# Patient Record
Sex: Female | Born: 1980 | Race: White | Hispanic: No | Marital: Married | Smoking: Never smoker
Health system: Southern US, Community
[De-identification: ages and names within clinical notes are randomized; demographics above are authoritative.]

## PROBLEM LIST (undated history)

## (undated) DIAGNOSIS — R51 Headache: Secondary | ICD-10-CM

## (undated) DIAGNOSIS — F329 Major depressive disorder, single episode, unspecified: Secondary | ICD-10-CM

## (undated) DIAGNOSIS — G43909 Migraine, unspecified, not intractable, without status migrainosus: Secondary | ICD-10-CM

## (undated) DIAGNOSIS — C73 Malignant neoplasm of thyroid gland: Secondary | ICD-10-CM

## (undated) DIAGNOSIS — G5603 Carpal tunnel syndrome, bilateral upper limbs: Secondary | ICD-10-CM

## (undated) DIAGNOSIS — F32A Depression, unspecified: Secondary | ICD-10-CM

## (undated) DIAGNOSIS — J189 Pneumonia, unspecified organism: Secondary | ICD-10-CM

## (undated) DIAGNOSIS — Z9889 Other specified postprocedural states: Secondary | ICD-10-CM

## (undated) DIAGNOSIS — R112 Nausea with vomiting, unspecified: Secondary | ICD-10-CM

## (undated) DIAGNOSIS — E042 Nontoxic multinodular goiter: Secondary | ICD-10-CM

## (undated) DIAGNOSIS — R61 Generalized hyperhidrosis: Secondary | ICD-10-CM

## (undated) HISTORY — DX: Major depressive disorder, single episode, unspecified: F32.9

## (undated) HISTORY — DX: Migraine, unspecified, not intractable, without status migrainosus: G43.909

## (undated) HISTORY — PX: BACK SURGERY: SHX140

## (undated) HISTORY — DX: Headache: R51

## (undated) HISTORY — DX: Depression, unspecified: F32.A

## (undated) HISTORY — PX: SYMPATHECTOMY: SHX792

---
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
## 2005-09-29 ENCOUNTER — Emergency Department (HOSPITAL_COMMUNITY): Admission: EM | Admit: 2005-09-29 | Discharge: 2005-09-30 | Payer: Self-pay | Admitting: Emergency Medicine

## 2011-09-23 ENCOUNTER — Encounter (HOSPITAL_COMMUNITY): Payer: Self-pay | Admitting: *Deleted

## 2011-09-23 ENCOUNTER — Emergency Department (HOSPITAL_COMMUNITY)
Admission: EM | Admit: 2011-09-23 | Discharge: 2011-09-23 | Disposition: A | Discharge status: Home / Self Care | Payer: Self-pay | Attending: Emergency Medicine | Admitting: Emergency Medicine

## 2011-09-23 ENCOUNTER — Emergency Department (HOSPITAL_COMMUNITY): Payer: Self-pay

## 2011-09-23 DIAGNOSIS — R1310 Dysphagia, unspecified: Secondary | ICD-10-CM | POA: Insufficient documentation

## 2011-09-23 DIAGNOSIS — M542 Cervicalgia: Secondary | ICD-10-CM | POA: Insufficient documentation

## 2011-09-23 DIAGNOSIS — E049 Nontoxic goiter, unspecified: Secondary | ICD-10-CM | POA: Insufficient documentation

## 2011-09-23 MED ORDER — HYDROCODONE-ACETAMINOPHEN 5-325 MG PO TABS: 2.0000 | ORAL_TABLET | ORAL | Status: AC | PRN

## 2011-09-23 NOTE — ED Provider Notes (Signed)
History     CSN: [id number]  Arrival date & time 09/23/11  1826   First MD Initiated Contact with Patient 09/23/11 1922      Chief Complaint  Patient presents with  . Neck Pain    (Consider location/radiation/quality/duration/timing/severity/associated sxs/prior treatment) HPI Comments: Patient presenting with dysphagia for the past 3 days and pain in her neck.  She reports that it is painful to swallow, but her throat is not sore.  She is concerned that she has a mass in her neck.  She denies any drooling, changes in voice, or difficulty breathing.  She has full ROM of her neck.  Patient is a 31 y.o. female presenting with neck pain. The history is provided by the patient.  Neck Pain  This is a new problem. Episode onset: Two days ago. The problem occurs constantly. The problem has been gradually worsening. The pain is associated with nothing (No acute injury or trauma.). There has been no fever. Pain location: anterior neck. Radiates to: No radiation. The pain is mild. The symptoms are aggravated by swallowing. She has tried nothing for the symptoms.    History reviewed. No pertinent past medical history.  Past Surgical History  Procedure Date  . Back surgery     No family history on file.  History  Substance Use Topics  . Smoking status: Never Smoker   . Smokeless tobacco: Not on file  . Alcohol Use: No    OB History    Grav Para Term Preterm Abortions TAB SAB Ect Mult Living                  Review of Systems  Constitutional: Negative for fever and chills.  HENT: Positive for trouble swallowing and neck pain. Negative for sore throat, drooling, neck stiffness, dental problem and voice change.   Respiratory: Negative for shortness of breath.   Gastrointestinal: Negative for nausea and vomiting.  Skin: Negative for rash.  Neurological: Negative for dizziness and light-headedness.  Hematological: Negative for adenopathy.    Allergies  Review of patient's  allergies indicates no known allergies.  Home Medications   Current Outpatient Rx  Name Route Sig Dispense Refill  . IBUPROFEN 200 MG PO TABS Oral Take 800 mg by mouth every 8 (eight) hours as needed. Pain      BP 133/85  Pulse 86  Temp 99.2 F (37.3 C) (Oral)  Resp 22  SpO2 99%  LMP 09/08/2011  Physical Exam  Nursing note and vitals reviewed. Constitutional: She appears well-developed and well-nourished. No distress.  HENT:  Head: Normocephalic and atraumatic. No trismus in the jaw.  Right Ear: Tympanic membrane and ear canal normal.  Left Ear: Tympanic membrane and ear canal normal.  Nose: Nose normal.  Mouth/Throat: Uvula is midline, oropharynx is clear and moist and mucous membranes are normal. No uvula swelling. No oropharyngeal exudate, posterior oropharyngeal edema, posterior oropharyngeal erythema or tonsillar abscesses.  Eyes: EOM are normal. Pupils are equal, round, and reactive to light.  Neck: Normal range of motion and phonation normal. Neck supple. No spinous process tenderness and no muscular tenderness present. No rigidity. No edema, no erythema and normal range of motion present. Thyromegaly present.  Cardiovascular: Normal rate, regular rhythm and normal heart sounds.   Pulmonary/Chest: Effort normal and breath sounds normal. No stridor. No respiratory distress. She has no wheezes. She has no rales.  Lymphadenopathy:    She has no cervical adenopathy.  Neurological: She is alert.  Skin: Skin is warm  and dry. No rash noted. She is not diaphoretic. No erythema.  Psychiatric: She has a normal mood and affect.    ED Course  Procedures (including critical care time)  Labs Reviewed - No data to display Dg Neck Soft Tissue  09/23/2011  *RADIOLOGY REPORT*  Clinical Data: Difficulty swallowing for 3 days.  Left neck soft tissue swelling.  NECK SOFT TISSUES - 1+ VIEW  Comparison: None.  Findings: The epiglottis and aryepiglottic fold do not appear thickened.  No  prevertebral soft tissue swelling.  No radiopaque foreign body.  No significant narrowing of the air column.  IMPRESSION: Single lateral view of the neck without cause of patient's discomfort detected as detailed above.  Radiopaque structure projects over the mandible.  Submandibular stone not excluded.  Original Report Authenticated By: Fuller Canada, M.D.     No diagnosis found.    MDM  Patient presenting with dysphagia and pain with swallowing for three days.  She reports that her throat is not sore.    Soft tissue neck does not show an identifiable cause.  Patient able to swallowing.  No drooling.  No difficulty breathing.  Normal voice. Uvula midline.  Thyroid appears mildly enlarged, but no distinct mass palpated.  Patient instructed to follow up with PCP.          Talaysia Pinheiro Hartley, PA-C 09/24/11 1228

## 2011-09-23 NOTE — ED Notes (Signed)
Pt reports Thursday night noted to neck and pain has increased since. Pt denies history of similar issues. Pt denies pain to other locations. Pt reports pain with swallowing.

## 2011-09-25 NOTE — ED Provider Notes (Signed)
Medical screening examination/treatment/procedure(s) were performed by non-physician practitioner and as supervising physician I was immediately available for consultation/collaboration.   Forbes Cellar, MD 09/25/11 [phone]

## 2016-02-07 ENCOUNTER — Ambulatory Visit (INDEPENDENT_AMBULATORY_CARE_PROVIDER_SITE_OTHER): Payer: BC Managed Care – PPO | Admitting: Neurology

## 2016-02-07 ENCOUNTER — Encounter: Payer: Self-pay | Admitting: Neurology

## 2016-02-07 VITALS — BP 146/86 | HR 89 | Resp 20 | Ht 70.0 in | Wt 257.0 lb

## 2016-02-07 DIAGNOSIS — G43709 Chronic migraine without aura, not intractable, without status migrainosus: Secondary | ICD-10-CM

## 2016-02-07 DIAGNOSIS — H539 Unspecified visual disturbance: Secondary | ICD-10-CM

## 2016-02-07 DIAGNOSIS — R51 Headache with orthostatic component, not elsewhere classified: Secondary | ICD-10-CM

## 2016-02-07 DIAGNOSIS — E662 Morbid (severe) obesity with alveolar hypoventilation: Secondary | ICD-10-CM

## 2016-02-07 DIAGNOSIS — R110 Nausea: Secondary | ICD-10-CM

## 2016-02-07 DIAGNOSIS — F329 Major depressive disorder, single episode, unspecified: Secondary | ICD-10-CM

## 2016-02-07 DIAGNOSIS — F32A Depression, unspecified: Secondary | ICD-10-CM | POA: Insufficient documentation

## 2016-02-07 DIAGNOSIS — R519 Headache, unspecified: Secondary | ICD-10-CM

## 2016-02-07 MED ORDER — SUMATRIPTAN SUCCINATE 100 MG PO TABS: 100.0000 mg | ORAL_TABLET | Freq: Once | ORAL | 12 refills | Status: DC | PRN

## 2016-02-07 MED ORDER — PROCHLORPERAZINE MALEATE 10 MG PO TABS: 10.0000 mg | ORAL_TABLET | Freq: Four times a day (QID) | ORAL | 11 refills | Status: DC | PRN

## 2016-02-07 MED ORDER — TOPIRAMATE ER 100 MG PO CAP24: 100.0000 mg | ORAL_CAPSULE | Freq: Every day | ORAL | 11 refills | Status: DC

## 2016-02-07 NOTE — Patient Instructions (Addendum)
Remember to drink plenty of fluid, eat healthy meals and do not skip any meals. Try to eat protein with a every meal and eat a healthy snack such as fruit or nuts in between meals. Try to keep a regular sleep-wake schedule and try to exercise daily, particularly in the form of walking, 20-30 minutes a day, if you can.   As far as your medications are concerned, I would like to suggest:  Topiramate at bedtime. Start with 25mg  x 1 week, then 50mg  x 1 week then 100mg  At onset of migraine, take imitrex and compazine. May repeat Imitrex once in 2 hours if needed.   As far as diagnostic testing: MRI brain  Our phone number is [phone]. We also have an after hours call service for urgent matters and there is a physician on-call for urgent questions. For any emergencies you know to call 911 or go to the nearest emergency room  Phentermine; Topiramate extended-release capsules What is this medicine? Phentermine; topiramate (FEN ter meen; toe PYRE a mate) is a combination of two medicines used with a reduced calorie diet and exercise to help you lose weight. This medicine is only available through certified pharmacies enrolled in a special program. Your healthcare professional will tell you where you can get your medicine. If you have additional questions, you can visit the manufacture's website at [email or web address] or contact them by phone at [phone]. COMMON BRAND NAME(S): Qsymia What should I tell my health care provider before I take this medicine? They need to know if you have any of these conditions: -agitation -diarrhea -depression or other mental illness -diabetes -glaucoma -heart disease -high or low blood pressure -history of anorexia or other eating disorder -history of substance abuse -kidney stones or kidney disease -liver disease -lung disease like asthma, obstructive pulmonary disease, emphysema -metabolic acidosis -on a ketogenic diet -scheduled for surgery or a  procedure -suicidal thoughts, plans, or attempt; a previous suicide attempt by you or a family member -taken an MAOI like Carbex, Eldepryl, Marplan, Nardil, or Parnate in last 14 days -thyroid disease -an unusual or allergic reaction to phentermine, topiramate, other medicines, foods, dyes, or preservatives -pregnant or trying to get pregnant -breast-feeding How should I use this medicine? Take this medicine by mouth with a glass of water. Follow the directions on the prescription label. Do not crush or chew. This medicine is usually taken with or without food once per day in the morning. Avoid taking this medicine in the evening. It may interfere with sleep. Take your doses at regular intervals. Do not take your medicine more often than directed. A special MedGuide will be given to you by the pharmacist with each prescription and refill. Be sure to read this information carefully each time. Talk to your pediatrician regarding the use of this medicine in children. Special care may be needed. What if I miss a dose? If you miss a dose, take it as soon as you can. If it is almost time for your next dose, take only that dose. Do not take double or extra doses. What may interact with this medicine? Do not take this medicine with any of the following medications: -MAOIs like Carbex, Eldepryl, Marplan, Nardil, and ParnateThis medicine may also interact with the following medications: -acetazolamide -amitriptyline -antihistamines for allergy, cough and cold -atropine -birth control pills -carbamazepine -certain medicines for bladder problems like oxybutynin, tolterodine -certain medicines for depression, anxiety, or psychotic disturbances -certain medicines for Parkinson's disease like benztropine, trihexyphenidyl -certain medicines for stomach  problems like dicyclomine, hyoscyamine -certain medicines for travel sickness like  scopolamine -dichlorphenamide -digoxin -diltiazem -diuretics -hydrochlorothiazide -ipratropium -lithium -medicines for diabetes -medicines for pain, sleep, or muscle relaxation -methazolamide -phenytoin -pioglitazone -stimulant medicines for attention disorders, weight loss, or to stay awake -valproic acid -zonisamide What should I watch for while using this medicine? Visit your doctor or health care professional for regular checks on your progress. This medicine is intended to be used in addition to a healthy diet and appropriate exercise. The best results are achieved this way. Do not increase or in any way change your dose without consulting your doctor or health care professional. Do not take this medicine within 6 hours of bedtime. It can keep you from getting to sleep. Avoid drinks that contain caffeine and try to stick to a regular bedtime every night. Do not stop taking this medicine suddenly. This increases the risk of seizures. This medicine can decrease sweating and increase your body temperature. Watch for signs of deceased sweating or fever. Avoid extreme heat, hot baths, and saunas. Be careful about exercising, especially in hot weather. Contact your health care provider right away if you notice a fever or decrease in sweating. You should drink plenty of fluids while taking this medicine. If you have had kidney stones in the past, this will help to reduce your chances of forming kidney stones. If you have stomach pain, with nausea or vomiting and yellowing of your eyes or skin, call your doctor immediately. You may get drowsy or dizzy. Do not drive, use machinery, or do anything that needs mental alertness until you know how this medicine affects you. Do not stand or sit up quickly, especially if you are an older patient. This reduces the risk of dizzy or fainting spells. Alcohol may increase dizziness and drowsiness. Avoid alcoholic drinks. This medicine may affect blood sugar  levels. If you have diabetes, check with your doctor or health care professional before you change your diet or the dose of your diabetic medicine. Patients and their families should watch out for worsening depression or thoughts of suicide. Also watch out for sudden changes in feelings such as feeling anxious, agitated, panicky, irritable, hostile, aggressive, impulsive, severely restless, overly excited and hyperactive, or not being able to sleep. If this happens, especially at the beginning of treatment or after a change in dose, call your health care professional. If you notice blurred vision, eye pain, or other eye problems, seek medical attention at once for an eye exam. This medicine may increase the chance of developing metabolic acidosis. If left untreated, this can cause kidney stones, bone disease, or slowed growth in children. Symptoms include breathing fast, fatigue, loss of appetite, irregular heartbeat, or loss of consciousness. Call your doctor immediately if you experience any of these side effects. Also, tell your doctor about any surgery you plan on having while taking this medicine since this may increase your risk for metabolic acidosis. Women who become pregnant while using this medicine should contact their physician immediately. You should also contact The [facility] Pregnancy Surveillance Program which is a program that monitors pregnancies that occur during treatment. Contact the program by calling [phone]. What side effects may I notice from receiving this medicine? Side effects that you should report to your doctor or health care professional as soon as possible: -allergic reactions like skin rash, itching or hives, swelling of the face, lips, or tongue -blood in the urine -changes in vision -chest pain or chest tightness -confusion -depressed mood -difficulty  breathing -dizziness -fast or irregular heartbeat -feeling anxious -irritable -loss of appetite -low blood  pressure -pain in the lower back or side -pain, tingling, numbness in the hands or feet -pain when urinating -palpitations -redness, blistering, peeling or loosening of the skin, including inside the mouth -shortness of breath -suicidal thoughts or other mood changes -trouble passing urine or change in the amount of urine -trouble walking, dizziness, loss of balance or coordination -unusually weak or tired -vomiting Side effects that usually do not require medical attention (report to your doctor or health care professional if they continue or are bothersome): -change in sex drive or performance -changes in vision -constipation -diarrhea -dry mouth -headache -nausea -tremors -trouble sleeping -upset stomach Where should I keep my medicine? Keep out of the reach of children. This medicine can be abused. Keep your medicine in a safe place to protect it from theft. Do not share this medicine with anyone. Selling or giving away this medicine is dangerous and against the law. This medicine may cause accidental overdose and death if taken by other adults, children, or pets. Mix any unused medicine with a substance like cat litter or coffee grounds. Then throw the medicine away in a sealed container like a sealed bag or a coffee can with a lid. Do not use the medicine after the expiration date. Store at room temperature between 15 and 25 degrees C (59 and 77 degrees F).  2017 [employer] Standard (2015-04-01 14:30:46)  Prochlorperazine tablets What is this medicine? PROCHLORPERAZINE (proe klor PER a zeen) helps to control severe nausea and vomiting. This medicine is also used to treat schizophrenia. It can also help patients who experience anxiety that is not due to psychological illness. This medicine may be used for other purposes; ask your health care provider or pharmacist if you have questions. COMMON BRAND NAME(S): Compazine What should I tell my health care provider before I take  this medicine? They need to know if you have any of these conditions: -blood disorders or disease -dementia -liver disease or jaundice -Parkinson's disease -uncontrollable movement disorder -an unusual or allergic reaction to prochlorperazine, other medicines, foods, dyes, or preservatives -pregnant or trying to get pregnant -breast-feeding How should I use this medicine? Take this medicine by mouth with a glass of water. Follow the directions on the prescription label. Take your doses at regular intervals. Do not take your medicine more often than directed. Do not stop taking this medicine suddenly. This can cause nausea, vomiting, and dizziness. Ask your doctor or health care professional for advice. Talk to your pediatrician regarding the use of this medicine in children. Special care may be needed. While this drug may be prescribed for children as young as 2 years for selected conditions, precautions do apply. Overdosage: If you think you have taken too much of this medicine contact a poison control center or emergency room at once. NOTE: This medicine is only for you. Do not share this medicine with others. What if I miss a dose? If you miss a dose, take it as soon as you can. If it is almost time for your next dose, take only that dose. Do not take double or extra doses. What may interact with this medicine? Do not take this medicine with any of the following medications: -amoxapine -antidepressants like citalopram, escitalopram, fluoxetine, paroxetine, and sertraline -deferoxamine -dofetilide -maprotiline -tricyclic antidepressants like amitriptyline, clomipramine, imipramine, nortiptyline and others This medicine may also interact with the following medications: -lithium -medicines for pain -phenytoin -propranolol -warfarin  This list may not describe all possible interactions. Give your health care provider a list of all the medicines, herbs, non-prescription drugs, or dietary  supplements you use. Also tell them if you smoke, drink alcohol, or use illegal drugs. Some items may interact with your medicine. What should I watch for while using this medicine? Visit your doctor or health care professional for regular checks on your progress. You may get drowsy or dizzy. Do not drive, use machinery, or do anything that needs mental alertness until you know how this medicine affects you. Do not stand or sit up quickly, especially if you are an older patient. This reduces the risk of dizzy or fainting spells. Alcohol may interfere with the effect of this medicine. Avoid alcoholic drinks. This medicine can reduce the response of your body to heat or cold. Dress warm in cold weather and stay hydrated in hot weather. If possible, avoid extreme temperatures like saunas, hot tubs, very hot or cold showers, or activities that can cause dehydration such as vigorous exercise. This medicine can make you more sensitive to the sun. Keep out of the sun. If you cannot avoid being in the sun, wear protective clothing and use sunscreen. Do not use sun lamps or tanning beds/booths. Your mouth may get dry. Chewing sugarless gum or sucking hard candy, and drinking plenty of water may help. Contact your doctor if the problem does not go away or is severe. What side effects may I notice from receiving this medicine? Side effects that you should report to your doctor or health care professional as soon as possible: -blurred vision -breast enlargement in men or women -breast milk in women who are not breast-feeding -chest pain, fast or irregular heartbeat -confusion, restlessness -dark yellow or brown urine -difficulty breathing or swallowing -dizziness or fainting spells -drooling, shaking, movement difficulty (shuffling walk) or rigidity -fever, chills, sore throat -involuntary or uncontrollable movements of the eyes, mouth, head, arms, and legs -seizures -stomach area pain -unusually weak or  tired -unusual bleeding or bruising -yellowing of skin or eyes Side effects that usually do not require medical attention (report to your doctor or health care professional if they continue or are bothersome): -difficulty passing urine -difficulty sleeping -headache -sexual dysfunction -skin rash, or itching This list may not describe all possible side effects. Call your doctor for medical advice about side effects. You may report side effects to FDA at 1-800-FDA-1088. Where should I keep my medicine? Keep out of the reach of children. Store at room temperature between 15 and 30 degrees C (59 and 86 degrees F). Protect from light. Throw away any unused medicine after the expiration date. NOTE: This sheet is a summary. It may not cover all possible information. If you have questions about this medicine, talk to your doctor, pharmacist, or health care provider.  2017 Elsevier/Gold Standard (2011-07-18 16:59:39)  Sumatriptan tablets What is this medicine? SUMATRIPTAN (soo ma TRIP tan) is used to treat migraines with or without aura. An aura is a strange feeling or visual disturbance that warns you of an attack. It is not used to prevent migraines. This medicine may be used for other purposes; ask your health care provider or pharmacist if you have questions. COMMON BRAND NAME(S): Imitrex, Migraine Pack What should I tell my health care provider before I take this medicine? They need to know if you have any of these conditions: -circulation problems in fingers and toes -diabetes -heart disease -high blood pressure -high cholesterol -history of irregular heartbeat -  history of stroke -kidney disease -liver disease -postmenopausal or surgical removal of uterus and ovaries -seizures -smoke tobacco -stomach or intestine problems -an unusual or allergic reaction to sumatriptan, other medicines, foods, dyes, or preservatives -pregnant or trying to get pregnant -breast-feeding How should  I use this medicine? Take this medicine by mouth with a glass of water. Follow the directions on the prescription label. This medicine is taken at the first symptoms of a migraine. It is not for everyday use. If your migraine headache returns after one dose, you can take another dose as directed. You must leave at least 2 hours between doses, and do not take more than 100 mg as a single dose. Do not take more than 200 mg total in any 24 hour period. If there is no improvement at all after the first dose, do not take a second dose without talking to your doctor or health care professional. Do not take your medicine more often than directed. Talk to your pediatrician regarding the use of this medicine in children. Special care may be needed. Overdosage: If you think you have taken too much of this medicine contact a poison control center or emergency room at once. NOTE: This medicine is only for you. Do not share this medicine with others. What if I miss a dose? This does not apply; this medicine is not for regular use. What may interact with this medicine? Do not take this medicine with any of the following medicines: -cocaine -ergot alkaloids like dihydroergotamine, ergonovine, ergotamine, methylergonovine -feverfew -MAOIs like Carbex, Eldepryl, Marplan, Nardil, and Parnate -other medicines for migraine headache like almotriptan, eletriptan, frovatriptan, naratriptan, rizatriptan, zolmitriptan -tryptophan This medicine may also interact with the following medications: -certain medicines for depression, anxiety, or psychotic disturbances This list may not describe all possible interactions. Give your health care provider a list of all the medicines, herbs, non-prescription drugs, or dietary supplements you use. Also tell them if you smoke, drink alcohol, or use illegal drugs. Some items may interact with your medicine. What should I watch for while using this medicine? Only take this medicine for a  migraine headache. Take it if you get warning symptoms or at the start of a migraine attack. It is not for regular use to prevent migraine attacks. You may get drowsy or dizzy. Do not drive, use machinery, or do anything that needs mental alertness until you know how this medicine affects you. To reduce dizzy or fainting spells, do not sit or stand up quickly, especially if you are an older patient. Alcohol can increase drowsiness, dizziness and flushing. Avoid alcoholic drinks. Smoking cigarettes may increase the risk of heart-related side effects from using this medicine. If you take migraine medicines for 10 or more days a month, your migraines may get worse. Keep a diary of headache days and medicine use. Contact your healthcare professional if your migraine attacks occur more frequently. What side effects may I notice from receiving this medicine? Side effects that you should report to your doctor or health care professional as soon as possible: -allergic reactions like skin rash, itching or hives, swelling of the face, lips, or tongue -bloody or watery diarrhea -hallucination, loss of contact with reality -pain, tingling, numbness in the face, hands, or feet -seizures -signs and symptoms of a blood clot such as breathing problems; changes in vision; chest pain; severe, sudden headache; pain, swelling, warmth in the leg; trouble speaking; sudden numbness or weakness of the face, arm, or leg -signs and symptoms of  a dangerous change in heartbeat or heart rhythm like chest pain; dizziness; fast or irregular heartbeat; palpitations, feeling faint or lightheaded; falls; breathing problems -signs and symptoms of a stroke like changes in vision; confusion; trouble speaking or understanding; severe headaches; sudden numbness or weakness of the face, arm, or leg; trouble walking; dizziness; loss of balance or coordination -stomach pain Side effects that usually do not require medical attention (report to  your doctor or health care professional if they continue or are bothersome): -changes in taste -facial flushing -headache -muscle cramps -muscle pain -nausea, vomiting -weak or tired This list may not describe all possible side effects. Call your doctor for medical advice about side effects. You may report side effects to FDA at 1-800-FDA-1088. Where should I keep my medicine? Keep out of the reach of children. Store at room temperature between 2 and 30 degrees C (36 and 86 degrees F). Throw away any unused medicine after the expiration date. NOTE: This sheet is a summary. It may not cover all possible information. If you have questions about this medicine, talk to your doctor, pharmacist, or health care provider.  2017 [employer]vier/Gold Standard (2015-04-01 12:38:23)

## 2016-02-07 NOTE — Progress Notes (Signed)
[facility]    Provider:  Dr Jaynee Eagles Referring Provider: Kathyrn Lass, MD Primary Care Physician:  Tawanna Solo, MD  CC:  Headaches  HPI:  Tara Ferguson is a 35 y.o. female here as a referral from Dr. Sabra Heck for headaches. PMHx depression, headaches, vitamin B12 and vitamin D deficiency, obesity. Headaches in the last year, started worsening to daily the last 6-7 months.  No inciting events, no head trauma, no new medication. She wakes up with the headache sometimes but moreso develop during the day later in the morning or the day. The headache is all day. Taking excedrin or laying down don;t help.  No medication overuse. They are more on one side the right or on the back in her neck and her neck hurts and she can;t move her head. Throbbing sensation. She wakes up with headaches and she is usuallu on her back. Smoke can trigger, smells can trigger. +nuasea. She feels better in a dark room laying down. Brother with migraines. She has severe nausea. She has scalp tenderness in the occipital area. She can function but it is hard to get through the day. No aura. She sleeps well, get enough sleep. She is fatigued, She hydrates well. She kept a headache diary and no significant patterns or foods that would trigger. Sitting in a dark quiet room is better. 30/30 headache days and at least 15 are migrainous. She has vision changes with the headaches. No other associated symptoms, modifiable factors or focal neurologic deficits.   Medications tried: Excedrin, Wellbutrin  Review of Systems: Patient complains of symptoms per HPI as well as the following symptoms: No CP, no SOB, no cardiac history. Pertinent negatives per HPI. All others negative.   Social History   Social History  . Marital status: Married    Spouse name: N/A  . Number of children: N/A  . Years of education: N/A   Occupational History  . Not on file.   Social History Main Topics  . Smoking status: Never  Smoker  . Smokeless tobacco: Never Used  . Alcohol use No  . Drug use: No  . Sexual activity: Not on file   Other Topics Concern  . Not on file   Social History Narrative  . No narrative on file    Family History  Problem Relation Age of Onset  . Depression Father   . Migraines Brother     Past Medical History:  Diagnosis Date  . Depression   . Headache     Past Surgical History:  Procedure Laterality Date  . NO PAST SURGERIES      Current Outpatient Prescriptions  Medication Sig Dispense Refill  . buPROPion (WELLBUTRIN XL) 150 MG 24 hr tablet Take 150 mg by mouth daily.    [name] buPROPion (WELLBUTRIN XL) 300 MG 24 hr tablet Take 300 mg by mouth daily.    [name] venlafaxine XR (EFFEXOR-XR) 75 MG 24 hr capsule Take 75 mg by mouth daily.    . prochlorperazine (COMPAZINE) 10 MG tablet Take 1 tablet (10 mg total) by mouth every 6 (six) hours as needed for nausea or vomiting. 30 tablet 11  . SUMAtriptan (IMITREX) 100 MG tablet Take 1 tablet (100 mg total) by mouth once as needed. May repeat in 2 hours if headache persists or recurs. 10 tablet 12  . Topiramate ER (TROKENDI XR) 100 MG CP24 Take 100 mg by mouth at bedtime. 30 capsule 11   No current facility-administered medications for this visit.  Allergies as of 02/07/2016  . (No Known Allergies)    Vitals: BP (!) 146/86   Pulse 89   Resp 20   Ht 5\' 10"  (1.778 m)   Wt 257 lb (116.6 kg)   BMI 36.88 kg/m  Last Weight:  Wt Readings from Last 1 Encounters:  02/07/16 257 lb (116.6 kg)   Last Height:   Ht Readings from Last 1 Encounters:  02/07/16 5\' 10"  (1.778 m)    Physical exam: Exam: Gen: NAD, conversant, well nourised, obese, well groomed                     CV: RRR, no MRG. No Carotid Bruits. No peripheral edema, warm, nontender Eyes: Conjunctivae clear without exudates or hemorrhage  Neuro: Detailed Neurologic Exam  Speech:    Speech is normal; fluent and spontaneous with normal comprehension.    Cognition:    The patient is oriented to person, place, and time;     recent and remote memory intact;     language fluent;     normal attention, concentration,     fund of knowledge Cranial Nerves:    The pupils are equal, round, and reactive to light. The fundi are normal and spontaneous venous pulsations are present. Visual fields are full to finger confrontation. Extraocular movements are intact. Trigeminal sensation is intact and the muscles of mastication are normal. The face is symmetric. The palate elevates in the midline. Hearing intact. Voice is normal. Shoulder shrug is normal. The tongue has normal motion without fasciculations.   Coordination:    Normal finger to nose and heel to shin. Normal rapid alternating movements.   Gait:    Heel-toe and tandem gait are normal.   Motor Observation:    No asymmetry, no atrophy, and no involuntary movements noted. Tone:    Normal muscle tone.    Posture:    Posture is normal. normal erect    Strength:    Strength is V/V in the upper and lower limbs.      Sensation: intact to LT     Reflex Exam:  DTR's:    Deep tendon reflexes in the upper and lower extremities are normal bilaterally.   Toes:    The toes are downgoing bilaterally.   Clonus:    Clonus is absent.      Assessment/Plan:  35 year old with chronic migraines without aura not intractable without status migrainosus.   Sleep evaluation: Waking up with headaches and morning headaches. R/o OSA and obesity hypoventilation syndrome MRI brain w/wo contrast due to worsening, positional morning headache with vision changes to rule out lesion in the brain, space occupying tumor and to look for signs of pseudotumor cerebri  Topiramate ER at bedtime. Start with 25mg  x 1 week, then 50mg  x 1 week then 100mg  At onset of migraine, take imitrex and compazine. May repeat Imitrex once in 2 hours if needed. No more than 2x in one day.  Labs: [place]   Discussed side effects  and teratogenicity, do not get pregnant, as per AVS.   As far as diagnostic testing: MRI brain   Discussed: To prevent or relieve headaches, try the following: Cool Compress. Lie down and place a cool compress on your head.  Avoid headache triggers. If certain foods or odors seem to have triggered your migraines in the past, avoid them. A headache diary might help you identify triggers.  Include physical activity in your daily routine. Try a daily walk or  other moderate aerobic exercise.  Manage stress. Find healthy ways to cope with the stressors, such as delegating tasks on your to-do list.  Practice relaxation techniques. Try deep breathing, yoga, massage and visualization.  Eat regularly. Eating regularly scheduled meals and maintaining a healthy diet might help prevent headaches. Also, drink plenty of fluids.  Follow a regular sleep schedule. Sleep deprivation might contribute to headaches Consider biofeedback. With this mind-body technique, you learn to control certain bodily functions - such as muscle tension, heart rate and blood pressure - to prevent headaches or reduce headache pain.    Proceed to emergency room if you experience new or worsening symptoms or symptoms do not resolve, if you have new neurologic symptoms or if headache is severe, or for any concerning symptom.   Cc: Dr. Delrae Sawyers, MD  [facility] [address] [place], [place] [address]  Phone [phone] Fax [phone]  A total of 60 minutes was spent face-to-face with this patient. Over half this time was spent on counseling patient on the chronic migraine diagnosis and different diagnostic and therapeutic options available.

## 2016-02-08 ENCOUNTER — Encounter: Payer: Self-pay | Admitting: Neurology

## 2016-02-08 ENCOUNTER — Telehealth: Payer: Self-pay | Admitting: *Deleted

## 2016-02-08 LAB — CBC
Hematocrit: 38.9 % (ref 34.0–46.6)
Hemoglobin: 13.2 g/dL (ref 11.1–15.9)
MCH: 31.5 pg (ref 26.6–33.0)
MCHC: 33.9 g/dL (ref 31.5–35.7)
MCV: 93 fL (ref 79–97)
Platelets: 219 10*3/uL (ref 150–379)
RBC: 4.19 x10E6/uL (ref 3.77–5.28)
RDW: 13.6 % (ref 12.3–15.4)
WBC: 5.8 10*3/uL (ref 3.4–10.8)

## 2016-02-08 LAB — COMPREHENSIVE METABOLIC PANEL
ALT: 34 IU/L — ABNORMAL HIGH (ref 0–32)
AST: 24 IU/L (ref 0–40)
Albumin/Globulin Ratio: 1.9 (ref 1.2–2.2)
Albumin: 4.5 g/dL (ref 3.5–5.5)
Alkaline Phosphatase: 97 IU/L (ref 39–117)
BUN/Creatinine Ratio: 12 (ref 9–23)
BUN: 8 mg/dL (ref 6–20)
Bilirubin Total: 0.3 mg/dL (ref 0.0–1.2)
CO2: 21 mmol/L (ref 18–29)
Calcium: 9 mg/dL (ref 8.7–10.2)
Chloride: 103 mmol/L (ref 96–106)
Creatinine, Ser: 0.68 mg/dL (ref 0.57–1.00)
GFR calc Af Amer: 133 mL/min/{1.73_m2} (ref >59)
GFR calc non Af Amer: 115 mL/min/{1.73_m2} (ref >59)
Globulin, Total: 2.4 g/dL (ref 1.5–4.5)
Glucose: 90 mg/dL (ref 65–99)
Potassium: 4.4 mmol/L (ref 3.5–5.2)
Sodium: 144 mmol/L (ref 134–144)
Total Protein: 6.9 g/dL (ref 6.0–8.5)

## 2016-02-08 LAB — TSH: TSH: 1.58 u[IU]/mL (ref 0.450–4.500)

## 2016-02-08 NOTE — Telephone Encounter (Signed)
-----   Message from Melvenia Beam, MD sent at 02/08/2016  5:32 PM EST ----- Labs unremarkable thanks

## 2016-02-08 NOTE — Telephone Encounter (Signed)
Called and spoke to pt about unremarkable labs per AA,MD. Pt verbalized understanding.

## 2016-02-09 ENCOUNTER — Encounter: Payer: Self-pay | Admitting: Neurology

## 2016-02-16 ENCOUNTER — Other Ambulatory Visit: Payer: BC Managed Care – PPO

## 2016-02-23 ENCOUNTER — Encounter: Payer: Self-pay | Admitting: Neurology

## 2016-02-25 ENCOUNTER — Other Ambulatory Visit: Payer: Self-pay | Admitting: Neurology

## 2016-02-25 MED ORDER — RIZATRIPTAN BENZOATE 10 MG PO TBDP: 10.0000 mg | ORAL_TABLET | ORAL | 11 refills | Status: DC | PRN

## 2016-02-29 ENCOUNTER — Ambulatory Visit (INDEPENDENT_AMBULATORY_CARE_PROVIDER_SITE_OTHER): Payer: BC Managed Care – PPO | Admitting: Neurology

## 2016-02-29 ENCOUNTER — Encounter: Payer: Self-pay | Admitting: Neurology

## 2016-02-29 VITALS — BP 134/89 | HR 63 | Resp 20 | Ht 70.0 in | Wt 253.0 lb

## 2016-02-29 DIAGNOSIS — F39 Unspecified mood [affective] disorder: Secondary | ICD-10-CM

## 2016-02-29 DIAGNOSIS — R51 Headache: Secondary | ICD-10-CM

## 2016-02-29 DIAGNOSIS — E669 Obesity, unspecified: Secondary | ICD-10-CM

## 2016-02-29 DIAGNOSIS — R0683 Snoring: Secondary | ICD-10-CM

## 2016-02-29 DIAGNOSIS — R400 Somnolence: Principal | ICD-10-CM

## 2016-02-29 DIAGNOSIS — R519 Headache, unspecified: Secondary | ICD-10-CM

## 2016-02-29 NOTE — Progress Notes (Signed)
Subjective:    Patient ID: Tara Ferguson is a 35 y.o. female.  HPI     Star Age, MD, PhD [facility] [address] [place], [place] [address]  Dear Tara Ferguson,   I saw your patient, Tara Ferguson, upon your kind request in my clinic today for initial consultation of her sleep disorder, in particular, concern for underlying obstructive sleep apnea. The patient is unaccompanied today (has the little boy she takes care of with her). As you know, [name] is a 35 year old right-handed woman with an underlying medical history of depression, recurrent migraines and obesity, who reports morning headaches, snoring and excessive daytime somnolence. I reviewed your office note from 02/07/2016. Her Epworth sleepiness score is 16 out of 24 today, her fatigue score is 51 out of 63. She is a nonsmoker, she does not drink alcohol or use illicit drugs. She does not drink caffeine on a daily basis, since she started the topamax. She was a soda drinker, up to 2 l per day, now just one glass of tea. She is married and lives with her wife and her 2 young children, ages 19 yo and 40 yo. She has a history of depression. She was started on Wellbutrin about a year ago and Effexor was added a few months ago. She has had worsening headaches and morning headaches for at least 6 months. She has been tired during the day before she started her antidepressant medications. She has not fallen asleep at work or at the wheel. 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  She goes to bed between 9 and 10 PM and rise time is around 7:30 AM. She has tried losing weight, lost about 10 lb in one year. She does not endorse a family history of obstructive sleep apnea. She is familiar with the diagnosis as her stepfather has sleep apnea and CPAP machine. She has frequent AM HAs, no nocturia, no RLS symptoms, does watch TV sitting up in bed, but turns it  off. Both children sleep in their own bedroom.  Her Past Medical History Is Significant For: Past Medical History:  Diagnosis Date  . Depression   . Headache     Her Past Surgical History Is Significant For: Past Surgical History:  Procedure Laterality Date  . NO PAST SURGERIES      Her Family History Is Significant For: Family History  Problem Relation Age of Onset  . Depression Father   . Migraines Brother     Her Social History Is Significant For: Social History   Social History  . Marital status: Married    Spouse name: N/A  . Number of children: N/A  . Years of education: N/A   Social History Main Topics  . Smoking status: Never Smoker  . Smokeless tobacco: Never Used  . Alcohol use No  . Drug use: No  . Sexual activity: Not Asked   Other Topics Concern  . None   Social History Narrative  . None    Her Allergies Are:  No Known Allergies:   Her Current Medications Are:  Outpatient Encounter Prescriptions as of 02/29/2016  Medication Sig  . buPROPion (WELLBUTRIN XL) 150 MG 24 hr tablet Take 150 mg by mouth daily.  [name] buPROPion (WELLBUTRIN XL) 300 MG 24 hr tablet Take 300 mg by mouth daily.  . prochlorperazine (COMPAZINE) 10 MG tablet Take 1 tablet (10 mg  total) by mouth every 6 (six) hours as needed for nausea or vomiting.  . rizatriptan (MAXALT-MLT) 10 MG disintegrating tablet Take 1 tablet (10 mg total) by mouth as needed for migraine. May repeat once in 2 hours if needed  . Topiramate ER (TROKENDI XR) 100 MG CP24 Take 100 mg by mouth at bedtime.  [name] venlafaxine XR (EFFEXOR-XR) 75 MG 24 hr capsule Take 75 mg by mouth daily.   No facility-administered encounter medications on file as of 02/29/2016.   :  Review of Systems:  Out of a complete 14 point review of systems, all are reviewed and negative with the exception of these symptoms as listed below: Review of Systems  Eyes: Positive for visual disturbance.  Neurological: Positive for numbness and  headaches.       Pt presents today to discuss her sleep. Pt has never had a sleep study and does not know if she snores. She wakes up with headaches and reports that she is tired all day.  Epworth Sleepiness Scale 0= would never doze 1= slight chance of dozing 2= moderate chance of dozing 3= high chance of dozing  Sitting and reading: 3 Watching TV: 3 Sitting inactive in a public place (ex. Theater or meeting): 1 As a passenger in a car for an hour without a break: 3 Lying down to rest in the afternoon: 3 Sitting and talking to someone: 0 Sitting quietly after lunch (no alcohol): 3 In a car, while stopped in traffic: 0 Total: 16   Psychiatric/Behavioral: Positive for sleep disturbance.    Objective:  Neurologic Exam  Physical Exam Physical Examination:   Vitals:   02/29/16 1111  BP: 134/89  Pulse: 63  Resp: 20   General Examination: The patient is a very pleasant 35 y.o. female in no acute distress. She appears well-developed and well-nourished and well groomed.   HEENT: Normocephalic, atraumatic, pupils are equal, round and reactive to light and accommodation. Extraocular tracking is good without limitation to gaze excursion or nystagmus noted. Normal smooth pursuit is noted. Hearing is grossly intact. Tympanic membranes are clear bilaterally. Face is symmetric with normal facial animation and normal facial sensation. Speech is clear with no dysarthria noted. There is no hypophonia. There is no lip, neck/head, jaw or voice tremor. Neck is supple with full range of passive and active motion. There are no carotid bruits on auscultation. Oropharynx exam reveals: mild mouth dryness, good dental hygiene and mild airway crowding, due to smaller airway opening. Mallampati is class II. Tongue protrudes centrally and palate elevates symmetrically. Tonsils are 1+ in size. Neck size is 16.5 inches. She has a Mild overbite. Nasal inspection reveals no significant nasal mucosal bogginess or  redness and no septal deviation.   Chest: Clear to auscultation without wheezing, rhonchi or crackles noted.  Heart: S1+S2+0, regular and normal without murmurs, rubs or gallops noted.   Abdomen: Soft, non-tender and non-distended with normal bowel sounds appreciated on auscultation.  Extremities: There is no pitting edema in the distal lower extremities bilaterally. Pedal pulses are intact.  Skin: Warm and dry without trophic changes noted.   Musculoskeletal: exam reveals no obvious joint deformities, tenderness or joint swelling or erythema.   Neurologically:  Mental status: The patient is awake, alert and oriented in all 4 spheres. Her immediate and remote memory, attention, language skills and fund of knowledge are appropriate. There is no evidence of aphasia, agnosia, apraxia or anomia. Speech is clear with normal prosody and enunciation. Thought process is linear. Mood  is normal and affect is normal.  Cranial nerves II - XII are as described above under HEENT exam. In addition: shoulder shrug is normal with equal shoulder height noted. Motor exam: Normal bulk, strength and tone is noted. There is no drift, tremor or rebound. Romberg is negative. Reflexes are 2+ throughout. Babinski: Toes are flexor bilaterally. Fine motor skills and coordination: intact with normal finger taps, normal hand movements, normal rapid alternating patting, normal foot taps and normal foot agility.  Cerebellar testing: No dysmetria or intention tremor on finger to nose testing. Heel to shin is unremarkable bilaterally. There is no truncal or gait ataxia.  Sensory exam: intact to light touch in the upper and lower extremities.  Gait, station and balance: She stands easily. No veering to one side is noted. No leaning to one side is noted. Posture is age-appropriate and stance is narrow based. Gait shows normal stride length and normal pace. No problems turning are noted. Tandem walk is unremarkable.            Assessment and Plan:   In summary, Malli Valk is a very pleasant 35 y.o.-year old female with an underlying medical history of depression, recurrent migraines and obesity, whose history and physical exam concerning for obstructive sleep apnea (OSA). She reports significant daytime somnolence, morning headaches, and given her history of obesity, mood disorder on antidepressant medication and recurrent headaches, as well as worsening headaches over the past 6 months, sleep study testing with a a lab attended sleep study is justified. I had a long chat with the patient about my findings and the diagnosis of OSA, its prognosis and treatment options. We talked about medical treatments, surgical interventions and non-pharmacological approaches. I explained in particular the risks and ramifications of untreated moderate to severe OSA, especially with respect to developing cardiovascular disease down the Road, including congestive heart failure, difficult to treat hypertension, cardiac arrhythmias, or stroke. Even type 2 diabetes has, in part, been linked to untreated OSA. Symptoms of untreated OSA include daytime sleepiness, memory problems, mood irritability and mood disorder such as depression and anxiety, lack of energy, as well as recurrent headaches, especially morning headaches. We talked about trying to maintain a healthy lifestyle in general, as well as the importance of weight control. I encouraged the patient to eat healthy, exercise daily and keep well hydrated, to keep a scheduled bedtime and wake time routine, to not skip any meals and eat healthy snacks in between meals. I advised the patient not to drive when feeling sleepy. I recommended the following at this time: sleep study with potential positive airway pressure titration. (We will score hypopneas at 3% and split the sleep study into diagnostic and treatment portion, if the estimated. 2 hour AHI is >15/h).   I explained the sleep test  procedure to the patient and also outlined possible surgical and non-surgical treatment options of OSA, including the use of a custom-made dental device (which would require a referral to a specialist dentist or oral surgeon), upper airway surgical options, such as pillar implants, radiofrequency surgery, tongue base surgery, and UPPP (which would involve a referral to an ENT surgeon). Rarely, jaw surgery such as mandibular advancement may be considered.  I also explained the CPAP treatment option to the patient, who indicated that she would be willing to try CPAP if the need arises. I explained the importance of being compliant with PAP treatment, not only for insurance purposes but primarily to improve Her symptoms, and for the patient's long  term health benefit, including to reduce Her cardiovascular risks. I answered all her questions today and the patient was in agreement. I would like to see her back after the sleep study is completed and encouraged her to call with any interim questions, concerns, problems or updates.   Thank you very much for allowing me to participate in the care of this nice patient. If I can be of any further assistance to you please do not hesitate to talk to me.  Sincerely,   Star Age, MD, PhD

## 2016-02-29 NOTE — Patient Instructions (Signed)

## 2016-03-10 ENCOUNTER — Ambulatory Visit (INDEPENDENT_AMBULATORY_CARE_PROVIDER_SITE_OTHER): Payer: BC Managed Care – PPO | Admitting: Neurology

## 2016-03-10 DIAGNOSIS — G4719 Other hypersomnia: Principal | ICD-10-CM

## 2016-03-10 DIAGNOSIS — G479 Sleep disorder, unspecified: Secondary | ICD-10-CM

## 2016-03-10 DIAGNOSIS — G4720 Circadian rhythm sleep disorder, unspecified type: Secondary | ICD-10-CM

## 2016-03-10 DIAGNOSIS — G4710 Hypersomnia, unspecified: Secondary | ICD-10-CM

## 2016-03-17 NOTE — Progress Notes (Signed)
Patient referred by Dr. Jaynee Eagles, seen by me on 02/29/16, diagnostic PSG on 03/10/16.   Please call and notify the patient that the recent sleep study did not show any significant obstructive sleep apnea or significant leg movements during sleep or any obvious intrinsic or organic sleep d/o. Please remind patient to try to maintain good sleep hygiene, which means: Keep a regular sleep and wake schedule, try not to exercise or have a meal within 2 hours of your bedtime, try to keep your bedroom conducive for sleep, that is, cool and dark, without light distractors such as an illuminated alarm clock, and refrain from watching TV right before sleep or in the middle of the night and do not keep the TV or radio on during the night. Also, try not to use or play on electronic devices at bedtime, such as your cell phone, tablet PC or laptop. If you like to read at bedtime on an electronic device, try to dim the background light as much as possible. Do not eat in the middle of the night.   Patient can FU with Dr. Jaynee Eagles as planned. Please route or fax report to PCP and referring MD, if other than PCP.  Once you have spoken to patient, you can close this encounter.   Thanks,  Star Age, MD, PhD [facility])

## 2016-03-17 NOTE — Procedures (Signed)
PATIENT'S NAME:  Tara Ferguson, Linke DOB:      Apr 06, 1980      MR#:    [id number]     DATE OF RECORDING: 03/10/2016 REFERRING M.D.:  Kathyrn Lass, MD Study Performed:   Baseline Polysomnogram HISTORY: 36 year old woman with a history of depression, recurrent migraines and obesity, who reports morning headaches, snoring and excessive daytime somnolence. The patient endorsed the Epworth Sleepiness Scale at 16 points. The patient's weight 253 pounds with a height of 70 (inches), resulting in a BMI of 36.3 kg/m2. The patient's neck circumference measured 16.5 inches.  CURRENT MEDICATIONS: Wellbutrin XL, Compazine, Maxalt-MLT, Trokendi, Effexor XR   PROCEDURE:  This is a multichannel digital polysomnogram utilizing the Somnostar 11.2 system.  Electrodes and sensors were applied and monitored per AASM Specifications.   EEG, EOG, Chin and Limb EMG, were sampled at 200 Hz.  ECG, Snore and Nasal Pressure, Thermal Airflow, Respiratory Effort, CPAP Flow and Pressure, Oximetry was sampled at 50 Hz. Digital video and audio were recorded.      BASELINE STUDY  Lights Out was at 21:18 and Lights On at 05:07.  Total recording time (TRT) was 466 minutes, with a total sleep time (TST) of  422 minutes.   The patient's sleep latency was 35.5 minutes, which is mildly increased.  REM latency was 190.5 minutes, which is delayed.  The sleep efficiency was 90.6%.     SLEEP ARCHITECTURE: WASO (Wake after sleep onset) was 18 minutes with mild sleep fragmentation noted.  There were 4.5 minutes in Stage N1, 275.5 minutes Stage N2, 107.5 minutes Stage N3 and 34.5 minutes in Stage REM.  The percentage of Stage N1 was 1.1%, Stage N2 was 65.3%, which is increased, Stage N3 was 25.5%, which is mildly increased, and Stage R (REM sleep) was 8.2%, which is reduced.   The arousals were noted as: 41 were spontaneous, 4 were associated with PLMs, 0 were associated with respiratory events.  Audio and video analysis did not show any abnormal or  unusual movements, behaviors, phonations or vocalizations. The patient took no bathroom breaks. Minimal to mild intermittent snoring was noted.  The EKG was in keeping with normal sinus rhythm (NSR).  RESPIRATORY ANALYSIS:  There were a total of 0 respiratory events:  0 obstructive apneas, 0 central apneas and 0 mixed apneas with a total of 0 apneas and an apnea index (AI) of 0 /hour. There were 0 hypopneas with a hypopnea index of 0 /hour. The patient also had 0 respiratory event related arousals (RERAs).      The total APNEA/HYPOPNEA INDEX (AHI) was 0/hour and the total RESPIRATORY DISTURBANCE INDEX was 0 /hour.  0 events occurred in REM sleep and 0 events in NREM. The REM AHI was 0 /hour, versus a non-REM AHI of 0. The patient spent 120.5 minutes of total sleep time in the supine position and 302 minutes in non-supine.. The supine AHI was 0.0 versus a non-supine AHI of 0.0.  OXYGEN SATURATION & C02:  The Wake baseline 02 saturation was 99%, with the lowest being 94%. Time spent below 89% saturation equaled 0 minutes.  PERIODIC LIMB MOVEMENTS:   The patient had a total of 5 Periodic Limb Movements.  The Periodic Limb Movement (PLM) index was .7 and the PLM Arousal index was .6/hour.    Post-study, the patient indicated that sleep was worse than usual.   IMPRESSION:  1. Hypersomnia, unspecified 2. Dysfunctions associated with sleep stages or arousal from sleep 3. Repetitive Intrusions of Sleep  RECOMMENDATIONS:  1. This study does not demonstrate any significant obstructive or central sleep disordered breathing. This study does not support an intrinsic sleep disorder as a cause of the patient's symptoms. Other causes, including circadian rhythm disturbances, an underlying mood disorder, medication effect and/or an underlying medical problem cannot be ruled out. 2. This study shows sleep fragmentation and abnormal sleep stage percentages; these are nonspecific findings and per se do not  signify an intrinsic sleep disorder or a cause for the patient's sleep-related symptoms. Causes include (but are not limited to) the first night effect of the sleep study, circadian rhythm disturbances, medication effect or an underlying mood disorder or medical problem.  3. The patient should be cautioned not to drive, work at heights, or operate dangerous or heavy equipment when tired or sleepy. Review and reiteration of good sleep hygiene measures should be pursued with any patient. 4. The patient can follow up with her referring provider.   I certify that I have reviewed the entire raw data recording prior to the issuance of this report in accordance with the Standards of Accreditation of the American Academy of Sleep Medicine (AASM)     Star Age, MD, PhD Diplomat, American Board of Psychiatry and Neurology (Neurology and Sleep Medicine)

## 2016-03-20 ENCOUNTER — Telehealth: Payer: Self-pay

## 2016-03-20 NOTE — Telephone Encounter (Signed)
I spoke to patient and she is aware of results and recommendations. I will mail her a sleep hygiene handout. Copy of report sent to PCP.

## 2016-03-20 NOTE — Telephone Encounter (Signed)
-----   Message from Star Age, MD sent at 03/17/2016  2:15 PM EST ----- Patient referred by Dr. Jaynee Eagles, seen by me on 02/29/16, diagnostic PSG on 03/10/16.   Please call and notify the patient that the recent sleep study did not show any significant obstructive sleep apnea or significant leg movements during sleep or any obvious intrinsic or organic sleep d/o. Please remind patient to try to maintain good sleep hygiene, which means: Keep a regular sleep and wake schedule, try not to exercise or have a meal within 2 hours of your bedtime, try to keep your bedroom conducive for sleep, that is, cool and dark, without light distractors such as an illuminated alarm clock, and refrain from watching TV right before sleep or in the middle of the night and do not keep the TV or radio on during the night. Also, try not to use or play on electronic devices at bedtime, such as your cell phone, tablet PC or laptop. If you like to read at bedtime on an electronic device, try to dim the background light as much as possible. Do not eat in the middle of the night.   Patient can FU with Dr. Jaynee Eagles as planned. Please route or fax report to PCP and referring MD, if other than PCP.  Once you have spoken to patient, you can close this encounter.   Thanks,  Star Age, MD, PhD [facility])

## 2016-04-03 ENCOUNTER — Other Ambulatory Visit: Payer: Self-pay | Admitting: Neurology

## 2016-04-03 ENCOUNTER — Telehealth: Payer: Self-pay | Admitting: Neurology

## 2016-04-03 MED ORDER — RIZATRIPTAN BENZOATE 10 MG PO TABS: 10.0000 mg | ORAL_TABLET | ORAL | 6 refills | Status: DC | PRN

## 2016-04-03 NOTE — Telephone Encounter (Signed)
Pt says she took sumatriptan at the onset of HA yesterday and then had to a second pill 2 hrs later. She says today she has ruptured blood vessel in the rt eye. Could this be from the medication. Please call

## 2016-04-03 NOTE — Telephone Encounter (Addendum)
Called and spoke to patient. I relayed message from Dr Jaynee Eagles. She verbalized understanding. She states the sumatriptan makes her feel funny but helps her headaches. She cannot take while she is at work because of this. She is willing to try a different medication. She states the rizatriptan ODT made her throw up because it was disintegrating. She is ok with trying the regular tablet form. Advised we will call that into her pharmacy. I verified her pharmacy. Advised I will call back if I have any further questions or concerns.

## 2016-04-03 NOTE — Telephone Encounter (Signed)
Done. thanks

## 2016-04-03 NOTE — Telephone Encounter (Signed)
It could be from the medication but it also could be from the headache itself or just coincidental. We could give her a different triptan instead of imitrex if she is willing to try let me know thanks

## 2016-04-03 NOTE — Telephone Encounter (Signed)
Dr Ahern- please advise 

## 2016-05-09 ENCOUNTER — Ambulatory Visit (INDEPENDENT_AMBULATORY_CARE_PROVIDER_SITE_OTHER): Payer: BC Managed Care – PPO | Admitting: Neurology

## 2016-05-09 ENCOUNTER — Telehealth: Payer: Self-pay | Admitting: Neurology

## 2016-05-09 ENCOUNTER — Encounter: Payer: Self-pay | Admitting: Neurology

## 2016-05-09 VITALS — BP 116/71 | HR 78 | Ht 70.0 in | Wt 258.2 lb

## 2016-05-09 DIAGNOSIS — M542 Cervicalgia: Secondary | ICD-10-CM

## 2016-05-09 DIAGNOSIS — G43009 Migraine without aura, not intractable, without status migrainosus: Principal | ICD-10-CM

## 2016-05-09 NOTE — Patient Instructions (Signed)
Remember to drink plenty of fluid, eat healthy meals and do not skip any meals. Try to eat protein with a every meal and eat a healthy snack such as fruit or nuts in between meals. Try to keep a regular sleep-wake schedule and try to exercise daily, particularly in the form of walking, 20-30 minutes a day, if you can.   As far as your medications are concerned, I would like to suggest: Increase to 150mg  Trokendi at night for 4 weeks.   I would like to see you back in 6-9 months, sooner if we need to. Please call us with any interim questions, concerns, problems, updates or refill requests.    Our phone number is [phone]. We also have an after hours call service for urgent matters and there is a physician on-call for urgent questions. For any emergencies you know to call 911 or go to the nearest emergency room

## 2016-05-09 NOTE — Telephone Encounter (Signed)
Need an accommodations letter for school to allow extra time for assignments

## 2016-05-09 NOTE — Progress Notes (Signed)
[id number]    Provider:  Dr Jaynee Eagles Referring Provider: Kathyrn Lass, MD Primary Care Physician:  Tawanna Solo, MD  CC:  Headaches  Interval history 05/09/2016: Patient returns for follow up. Sleep eval was negative for OSA or PLMS or other pathology. Topiramate is helping. She has 15 headache days with at least 8 migraines. When she takes the acute medication triptan maxalt it usually works, only had to take it twice for one headache. She had to take compazine once for severe nausea. She has 2 children 5 and 4 and thinking about it in the future not anytime soon. She is having lots of neck pain that exacerbates headaches, stiffness, worse at night. Integrative therapies for massage and physical therapy for migraines, musculoskeletal neck pain and cervicalgia. She is married to a woman.   HPI:  Tara Ferguson is a 36 y.o. female here as a referral from Dr. Sabra Heck for headaches. PMHx depression, headaches, vitamin B12 and vitamin D deficiency, obesity. Headaches in the last year, started worsening to daily the last 6-7 months.  No inciting events, no head trauma, no new medication. She wakes up with the headache sometimes but moreso develop during the day later in the morning or the day. The headache is all day. Taking excedrin or laying down don;t help.  No medication overuse. They are more on one side the right or on the back in her neck and her neck hurts and she can;t move her head. Throbbing sensation. She wakes up with headaches and she is usuallu on her back. Smoke can trigger, smells can trigger. +nuasea. She feels better in a dark room laying down. Brother with migraines. She has severe nausea. She has scalp tenderness in the occipital area. She can function but it is hard to get through the day. No aura. She sleeps well, get enough sleep. She is fatigued, She hydrates well. She kept a headache diary and no significant patterns or foods that would trigger. Sitting in a  dark quiet room is better. 30/30 headache days and at least 15 are migrainous. She has vision changes with the headaches. No other associated symptoms, modifiable factors or focal neurologic deficits.   Medications tried: Excedrin, Wellbutrin  Review of Systems: Patient complains of symptoms per HPI as well as the following symptoms: No CP, no SOB, no cardiac history. Pertinent negatives per HPI. All others negative.  Social History   Social History  . Marital status: Married    Spouse name: N/A  . Number of children: 2  . Years of education: 14   Occupational History  . Nanny   . Part-time [employer]   . Student     Business   Social History Main Topics  . Smoking status: Never Smoker  . Smokeless tobacco: Never Used  . Alcohol use No  . Drug use: No  . Sexual activity: Not on file   Other Topics Concern  . Not on file   Social History Narrative   Lives at home w/ her husband and children   Right-handed   Caffeine: 1 glass sweet tea per day    Family History  Problem Relation Age of Onset  . Depression Father   . Migraines Brother     Past Medical History:  Diagnosis Date  . Depression   . Headache     Past Surgical History:  Procedure Laterality Date  . NO PAST SURGERIES      Current Outpatient Prescriptions  Medication Sig Dispense  Refill  . buPROPion (WELLBUTRIN XL) 150 MG 24 hr tablet Take 150 mg by mouth daily.    [name] buPROPion (WELLBUTRIN XL) 300 MG 24 hr tablet Take 300 mg by mouth daily.    . prochlorperazine (COMPAZINE) 10 MG tablet Take 1 tablet (10 mg total) by mouth every 6 (six) hours as needed for nausea or vomiting. 30 tablet 11  . Topiramate ER (TROKENDI XR) 100 MG CP24 Take 100 mg by mouth at bedtime. 30 capsule 11  . venlafaxine XR (EFFEXOR-XR) 75 MG 24 hr capsule Take 75 mg by mouth daily.    . SUMAtriptan (IMITREX) 100 MG tablet      No current facility-administered medications for this visit.     Allergies as of 05/09/2016   . (No Known Allergies)    Vitals: BP 116/71   Pulse 78   Ht 5\' 10"  (1.778 m)   Wt 258 lb 3.2 oz (117.1 kg)   BMI 37.05 kg/m  Last Weight:  Wt Readings from Last 1 Encounters:  05/09/16 258 lb 3.2 oz (117.1 kg)   Last Height:   Ht Readings from Last 1 Encounters:  05/09/16 5\' 10"  (1.778 m)     Physical exam: Exam: Gen: NAD, conversant, well nourised, obese, well groomed                     CV: RRR, no MRG. No Carotid Bruits. No peripheral edema, warm, nontender Eyes: Conjunctivae clear without exudates or hemorrhage  Neuro: Detailed Neurologic Exam  Speech:    Speech is normal; fluent and spontaneous with normal comprehension.  Cognition:    The patient is oriented to person, place, and time;     recent and remote memory intact;     language fluent;     normal attention, concentration,     fund of knowledge Cranial Nerves:    The pupils are equal, round, and reactive to light. The fundi are normal and spontaneous venous pulsations are present. Visual fields are full to finger confrontation. Extraocular movements are intact. Trigeminal sensation is intact and the muscles of mastication are normal. The face is symmetric. The palate elevates in the midline. Hearing intact. Voice is normal. Shoulder shrug is normal. The tongue has normal motion without fasciculations.   Coordination:    Normal finger to nose and heel to shin. Normal rapid alternating movements.   Gait:    Heel-toe and tandem gait are normal.   Motor Observation:    No asymmetry, no atrophy, and no involuntary movements noted. Tone:    Normal muscle tone.    Posture:    Posture is normal. normal erect    Strength:    Strength is V/V in the upper and lower limbs.      Sensation: intact to LT     Reflex Exam:  DTR's:    Deep tendon reflexes in the upper and lower extremities are normal bilaterally.   Toes:    The toes are downgoing bilaterally.   Clonus:    Clonus is absent.        Assessment/Plan:  36 year old with chronic migraines without aura not intractable without status migrainosus.   Sleep evaluation: was negative MRI brain w/wo contrast due to worsening, positional morning headache with vision changes to rule out lesion in the brain, space occupying tumor and to look for signs of pseudotumor cerebri. She has not had it done yet  Topiramate ER at bedtime. Increase to 150mg  at night and  then 200mg . Call with updates and we can send in refill with higher dose. Samples of cambia.  At onset of migraine, take Maxalt, compazine and cambia. May repeat once in 2 hours if needed. No more than 2x in one day. Discussed side effects and teratogenicity, do not get pregnant, as per AVS.  Need an accommodations letter for school to allow extra time for assignments Integrative therapies PT, massage and acupuncture F/u 6-9 months   Sarina Ill, MD  [facility] [address] [place], [place] [address]  Phone [phone] Fax [phone]  A total of 30 minutes was spent face-to-face with this patient. Over half this time was spent on counseling patient on the migraine, neck pain diagnosis and different diagnostic and therapeutic options available.

## 2016-05-10 ENCOUNTER — Telehealth: Payer: Self-pay | Admitting: Neurology

## 2016-05-10 NOTE — Telephone Encounter (Signed)
Referral sent to Integrative therapies .  °

## 2016-05-11 ENCOUNTER — Encounter: Payer: Self-pay | Admitting: Neurology

## 2016-05-11 NOTE — Telephone Encounter (Signed)
Tara Ferguson, would you mind writing a letter for patient? She wants accomodations for school. Just something simple saying she has chronic migraines and may need extra time to complete assignment due to severity of condition. Thanks!

## 2016-05-15 NOTE — Telephone Encounter (Signed)
Received faxed PT eval. Plan for 1 visit every other week for 8-12 wks. Placed in MD file for review.

## 2016-05-15 NOTE — Telephone Encounter (Signed)
Hi Dr. Jaynee Eagles,     This is the fax number for my school    Attn: Tod Persia  [phone]    Thank you again!

## 2016-05-16 NOTE — Telephone Encounter (Signed)
Letter completed, printed, awaiting MD review/signature.

## 2016-05-18 NOTE — Telephone Encounter (Signed)
Letter signed and faxed as requested

## 2016-06-22 ENCOUNTER — Encounter: Payer: Self-pay | Admitting: Neurology

## 2016-06-27 ENCOUNTER — Ambulatory Visit (HOSPITAL_COMMUNITY): Payer: Self-pay | Admitting: Psychiatry

## 2016-11-23 ENCOUNTER — Ambulatory Visit: Payer: BC Managed Care – PPO | Admitting: Neurology

## 2016-12-27 ENCOUNTER — Other Ambulatory Visit: Payer: Self-pay | Admitting: Orthopaedic Surgery

## 2016-12-27 ENCOUNTER — Ambulatory Visit: Payer: BC Managed Care – PPO | Admitting: Neurology

## 2016-12-27 DIAGNOSIS — M4726 Other spondylosis with radiculopathy, lumbar region: Principal | ICD-10-CM

## 2017-01-02 ENCOUNTER — Ambulatory Visit
Admission: RE | Admit: 2017-01-02 | Discharge: 2017-01-02 | Disposition: A | Discharge status: Home / Self Care | Payer: Self-pay | Source: Ambulatory Visit | Attending: Orthopaedic Surgery | Admitting: Orthopaedic Surgery

## 2017-01-02 DIAGNOSIS — M4726 Other spondylosis with radiculopathy, lumbar region: Principal | ICD-10-CM

## 2017-02-07 ENCOUNTER — Ambulatory Visit: Payer: BC Managed Care – PPO | Admitting: Neurology

## 2017-02-08 ENCOUNTER — Encounter: Payer: Self-pay | Admitting: Neurology

## 2017-03-23 ENCOUNTER — Other Ambulatory Visit: Payer: Self-pay | Admitting: Neurology

## 2017-03-27 ENCOUNTER — Other Ambulatory Visit: Payer: Self-pay | Admitting: *Deleted

## 2017-03-27 MED ORDER — SUMATRIPTAN SUCCINATE 100 MG PO TABS: ORAL_TABLET | ORAL | 11 refills | Status: DC

## 2017-03-27 NOTE — Telephone Encounter (Signed)
Patient called back and stated that she is requesting for the refill of Sumatriptan and not Rizatriptan because it helps her migraines better. Informed her that I will discuss with Dr. Jaynee Eagles since order is not open in computer. Her last refill of Sumatriptan was 01/06/17. She verbalized understanding and appreciation.

## 2017-03-27 NOTE — Telephone Encounter (Signed)
E-scribed refill of Sumatriptan for 1 year per Dr. Jaynee Eagles.

## 2017-03-27 NOTE — Addendum Note (Signed)
Addended by: Gildardo Griffes on: 03/27/2017 01:19 PM   Modules accepted: Orders

## 2017-03-27 NOTE — Telephone Encounter (Signed)
Received a refill request for Sumatriptan 100 mg tab. Patient does not have current order for Sumatriptan, looks like she has been prescribed Maxalt in the past by Dr. Jaynee Eagles. I called the patient and LVM asking for call back to clarify which medication she is taking and then will discuss refill with Dr. Jaynee Eagles.

## 2017-05-01 ENCOUNTER — Ambulatory Visit: Payer: BC Managed Care – PPO | Admitting: Neurology

## 2017-08-15 ENCOUNTER — Ambulatory Visit: Payer: BC Managed Care – PPO | Admitting: Neurology

## 2017-08-15 ENCOUNTER — Encounter: Payer: Self-pay | Admitting: Neurology

## 2017-08-15 VITALS — BP 140/85 | HR 72 | Ht 70.0 in | Wt 267.0 lb

## 2017-08-15 DIAGNOSIS — G43709 Chronic migraine without aura, not intractable, without status migrainosus: Principal | ICD-10-CM

## 2017-08-15 MED ORDER — TOPIRAMATE ER 100 MG PO CAP24: 100.0000 mg | ORAL_CAPSULE | Freq: Every day | ORAL | 11 refills | Status: DC

## 2017-08-15 MED ORDER — PROCHLORPERAZINE MALEATE 10 MG PO TABS: 10.0000 mg | ORAL_TABLET | Freq: Four times a day (QID) | ORAL | 11 refills | Status: DC | PRN

## 2017-08-15 MED ORDER — SUMATRIPTAN SUCCINATE 100 MG PO TABS: ORAL_TABLET | ORAL | 11 refills | Status: DC

## 2017-08-15 NOTE — Progress Notes (Signed)
[facility]    Provider:  Dr Jaynee Eagles Referring Provider: Kathyrn Lass, MD Primary Care Physician:  Kathyrn Lass, MD  CC:  Headaches  Interval history 08/15/2017: She was taken off of wellbutrin and then also stopped her Trokendi. On Trokendi she was having one day a week.  Since she has been off of the Trokendi getting worse, daily headaches, all migainous. Discussed options for preventative and acute management. Will restart Trokendi.  Interval history 05/09/2016: Patient returns for follow up. Sleep eval was negative for OSA or PLMS or other pathology. Topiramate is helping. She has 15 headache days with at least 8 migraines. When she takes the acute medication triptan maxalt it usually works, only had to take it twice for one headache. She had to take compazine once for severe nausea. She has 2 children 5 and 4 and thinking about it in the future not anytime soon. She is having lots of neck pain that exacerbates headaches, stiffness, worse at night. Integrative therapies for massage and physical therapy for migraines, musculoskeletal neck pain and cervicalgia. She is married to a woman.   HPI:  Tara Ferguson is a 37 y.o. female here as a referral from Dr. Sabra Heck for headaches. PMHx depression, headaches, vitamin B12 and vitamin D deficiency, obesity. Headaches in the last year, started worsening to daily the last 6-7 months.  No inciting events, no head trauma, no new medication. She wakes up with the headache sometimes but moreso develop during the day later in the morning or the day. The headache is all day. Taking excedrin or laying down don;t help.  No medication overuse. They are more on one side the right or on the back in her neck and her neck hurts and she can;t move her head. Throbbing sensation. She wakes up with headaches and she is usuallu on her back. Smoke can trigger, smells can trigger. +nuasea. She feels better in a dark room laying down. Brother with migraines.  She has severe nausea. She has scalp tenderness in the occipital area. She can function but it is hard to get through the day. No aura. She sleeps well, get enough sleep. She is fatigued, She hydrates well. She kept a headache diary and no significant patterns or foods that would trigger. Sitting in a dark quiet room is better. 30/30 headache days and at least 15 are migrainous. She has vision changes with the headaches. No other associated symptoms, modifiable factors or focal neurologic deficits.   Medications tried: Excedrin, Wellbutrin  Review of Systems: Patient complains of symptoms per HPI as well as the following symptoms: Migraines. No CP, no SOB, no cardiac history. Pertinent negatives per HPI. All others negative.  Social History   Socioeconomic History  . Marital status: Married    Spouse name: Not on file  . Number of children: 2  . Years of education: 62  . Highest education level: Not on file  Occupational History  . Occupation: Nanny  . Occupation: [place]  . Occupation: Ship broker    Comment: Business  Social Needs  . Financial resource strain: Not on file  . Food insecurity:    Worry: Not on file    Inability: Not on file  . Transportation needs:    Medical: Not on file    Non-medical: Not on file  Tobacco Use  . Smoking status: Never Smoker  . Smokeless tobacco: Never Used  Substance and Sexual Activity  . Alcohol use: No  . Drug use:  No  . Sexual activity: Not on file  Lifestyle  . Physical activity:    Days per week: Not on file    Minutes per session: Not on file  . Stress: Not on file  Relationships  . Social connections:    Talks on phone: Not on file    Gets together: Not on file    Attends religious service: Not on file    Active member of club or organization: Not on file    Attends meetings of clubs or organizations: Not on file    Relationship status: Not on file  . Intimate partner violence:    Fear of current or ex  partner: Not on file    Emotionally abused: Not on file    Physically abused: Not on file    Forced sexual activity: Not on file  Other Topics Concern  . Not on file  Social History Narrative   Lives at home w/ her husband and children   Right-handed   Caffeine: 1 glass sweet tea per day    Family History  Problem Relation Age of Onset  . Depression Father   . Migraines Brother     Past Medical History:  Diagnosis Date  . Depression   . Headache     Past Surgical History:  Procedure Laterality Date  . NO PAST SURGERIES      Current Outpatient Medications  Medication Sig Dispense Refill  . prochlorperazine (COMPAZINE) 10 MG tablet Take 1 tablet (10 mg total) by mouth every 6 (six) hours as needed for nausea or vomiting. 30 tablet 11  . sertraline (ZOLOFT) 50 MG tablet Take 50 mg by mouth daily.  0  . SUMAtriptan (IMITREX) 100 MG tablet Take 1 tablet by mouth once as needed. May repeat in 2 hours if headache persists or reoccurs. Do not take more than 2 tablets in 24 hours. 10 tablet 11  . Topiramate ER (TROKENDI XR) 100 MG CP24 Take 100 mg by mouth at bedtime. 30 capsule 11   No current facility-administered medications for this visit.     Allergies as of 08/15/2017 - Review Complete 08/15/2017  Allergen Reaction Noted  . Wellbutrin [bupropion]  08/15/2017    Vitals: BP 140/85 (BP Location: Right Arm, Patient Position: Sitting)   Pulse 72   Ht 5\' 10"  (1.778 m)   Wt 267 lb (121.1 kg)   BMI 38.31 kg/m  Last Weight:  Wt Readings from Last 1 Encounters:  08/15/17 267 lb (121.1 kg)   Last Height:   Ht Readings from Last 1 Encounters:  08/15/17 5\' 10"  (1.778 m)     Physical exam: Exam: Gen: NAD, conversant, well nourised, obese, well groomed                     CV: RRR, no MRG. No Carotid Bruits. No peripheral edema, warm, nontender Eyes: Conjunctivae clear without exudates or hemorrhage  Neuro: Detailed Neurologic Exam  Speech:    Speech is normal;  fluent and spontaneous with normal comprehension.  Cognition:    The patient is oriented to person, place, and time;     recent and remote memory intact;     language fluent;     normal attention, concentration,     fund of knowledge Cranial Nerves:    The pupils are equal, round, and reactive to light. The fundi are normal and spontaneous venous pulsations are present. Visual fields are full to finger confrontation. Extraocular movements are intact. Trigeminal  sensation is intact and the muscles of mastication are normal. The face is symmetric. The palate elevates in the midline. Hearing intact. Voice is normal. Shoulder shrug is normal. The tongue has normal motion without fasciculations.   Coordination:    Normal finger to nose and heel to shin. Normal rapid alternating movements.   Gait:    Heel-toe and tandem gait are normal.   Motor Observation:    No asymmetry, no atrophy, and no involuntary movements noted. Tone:    Normal muscle tone.    Posture:    Posture is normal. normal erect    Strength:    Strength is V/V in the upper and lower limbs.      Sensation: intact to LT     Reflex Exam:  DTR's:    Deep tendon reflexes in the upper and lower extremities are normal bilaterally.   Toes:    The toes are downgoing bilaterally.   Clonus:    Clonus is absent.      Assessment/Plan:  37 year old with chronic migraines without aura not intractable without status migrainosus.   Sleep evaluation: was negative MRI brain w/wo contrast due to worsening, positional morning headache with vision changes to rule out lesion in the brain, space occupying tumor and to look for signs of pseudotumor cerebri. Never had it completed.  Restart Trokendi at bedtime.  Continue sertraline also used in migraine prevention At onset of migraine, take Maxalt, compazine. May repeat Maxalt once in 2 hours if needed. No more than 2x in one day. Discussed side effects and  teratogenicity, do not get pregnant on these medications  Integrative therapies PT, massage and acupuncture in the past was ordered  Discussed: To prevent or relieve headaches, try the following: Cool Compress. Lie down and place a cool compress on your head.  Avoid headache triggers. If certain foods or odors seem to have triggered your migraines in the past, avoid them. A headache diary might help you identify triggers.  Include physical activity in your daily routine. Try a daily walk or other moderate aerobic exercise.  Manage stress. Find healthy ways to cope with the stressors, such as delegating tasks on your to-do list.  Practice relaxation techniques. Try deep breathing, yoga, massage and visualization.  Eat regularly. Eating regularly scheduled meals and maintaining a healthy diet might help prevent headaches. Also, drink plenty of fluids.  Follow a regular sleep schedule. Sleep deprivation might contribute to headaches Consider biofeedback. With this mind-body technique, you learn to control certain bodily functions - such as muscle tension, heart rate and blood pressure - to prevent headaches or reduce headache pain.    Proceed to emergency room if you experience new or worsening symptoms or symptoms do not resolve, if you have new neurologic symptoms or if headache is severe, or for any concerning symptom.   Provided education and documentation from American headache Society toolbox including articles on: chronic migraine medication overuse headache, chronic migraines, prevention of migraines, behavioral and other nonpharmacologic treatments for headache.   Sarina Ill, MD  [facility] [address] [place],  [address]  Phone [phone] Fax [phone] A total of 25 minutes was spent face-to-face with this patient. Over half this time was spent on counseling patient on the migraine diagnosis and different therapeutic options,   risks ans benefits of management, compliance, and risk factor reduction and education.

## 2018-01-09 ENCOUNTER — Encounter: Payer: Self-pay | Admitting: Emergency Medicine

## 2018-01-09 DIAGNOSIS — F4312 Post-traumatic stress disorder, chronic: Secondary | ICD-10-CM

## 2018-01-09 DIAGNOSIS — F39 Unspecified mood [affective] disorder: Principal | ICD-10-CM

## 2018-01-25 ENCOUNTER — Ambulatory Visit (INDEPENDENT_AMBULATORY_CARE_PROVIDER_SITE_OTHER): Payer: BC Managed Care – PPO | Admitting: Psychiatry

## 2018-01-25 ENCOUNTER — Encounter: Payer: Self-pay | Admitting: Psychiatry

## 2018-01-25 VITALS — BP 115/68 | HR 71

## 2018-01-25 DIAGNOSIS — F39 Unspecified mood [affective] disorder: Secondary | ICD-10-CM

## 2018-01-25 DIAGNOSIS — F4312 Post-traumatic stress disorder, chronic: Principal | ICD-10-CM

## 2018-01-25 DIAGNOSIS — F422 Mixed obsessional thoughts and acts: Secondary | ICD-10-CM

## 2018-01-25 MED ORDER — SERTRALINE HCL 100 MG PO TABS: 200.0000 mg | ORAL_TABLET | Freq: Every day | ORAL | 0 refills | Status: DC

## 2018-01-25 MED ORDER — LAMOTRIGINE 150 MG PO TABS: 150.0000 mg | ORAL_TABLET | Freq: Every day | ORAL | 1 refills | Status: DC

## 2018-01-25 NOTE — Progress Notes (Signed)
Tara Ferguson [id number] 06-18-1980 37 y.o.  Subjective:   Patient ID:  Tara Ferguson is a 37 y.o. (DOB January 03, 1981) female.  Chief Complaint:  Chief Complaint  Patient presents with  . Anxiety  . Depression  . Sleeping Problem    HPI Tara Ferguson presents to the office today for follow-up of mood and anxiety. She reports that she had to cancel f/u apt due to having back surgery.   She reports that increase in Sertraline has been helpful for anxiety but has not noticed a significant change in depression or energy. She reports that she continues to have some obsessions and compulsions with checking locks and doors and will stay awake and worry unless she has checked certain things herself. She reports that she continues to have anxiety in certain situations, such as crowds, "but it's not all the time." Reports that she continues to have some intrusive thoughts and that this has improved some and she is limiting her exposure to current events since this seems to trigger some of these thoughts. She reports occasional anxiety attacks with increased heart rate and "space out for a minute." She reports frequently feeling overwhelmed- "I get overwhelmed really easy." Decreased worry.  Reports persistent sad mood. Denies any recent irritability. "My biggest struggle is getting out of bed in the mornings" and describes feeling "I don't want to face the day." She reports that once she is out of bed and started daily routine that energy and motivation seem to improve. Reports energy and motivation vary throughout the day. She reports that her sleep is inconsistent. Reports difficulty falling asleep. Typically does not have difficulty staying asleep. Denies nightmares. Sleeping from 8-9 pm until 6:30 am. Reports that she never feels rested upon awakening. Denies any change in appetite. She reports that she will try to multi-task and is unable to accomplish things due to getting distracted and starting  other tasks before completing initial task. Denies SI.   Denies any risky or impulsive behavior.   Reports that she is enjoying her job.  Past medication trials: Prozac-no improvement Effexor-ineffective Sertraline Wellbutrin-headaches, worsening irritability Latuda-ineffective, nausea vomiting Vraylar Topamax-taken for headaches Lamictal Lithium BuSpar  Review of Systems:  Review of Systems  Musculoskeletal: Negative for gait problem.       She reports decreased back pain after recent back surgery.  Neurological: Negative for tremors.  Psychiatric/Behavioral:       Please refer to HPI   Reports that she had physical exam about 7 months ago and lab work was WNL.    Medications: I have reviewed the patient's current medications.  Current Outpatient Medications  Medication Sig Dispense Refill  . lamoTRIgine (LAMICTAL) 150 MG tablet Take 1 tablet (150 mg total) by mouth daily. 90 tablet 1  . prochlorperazine (COMPAZINE) 10 MG tablet Take 1 tablet (10 mg total) by mouth every 6 (six) hours as needed for nausea or vomiting. 30 tablet 11  . SUMAtriptan (IMITREX) 100 MG tablet Take 1 tablet by mouth once as needed. May repeat in 2 hours if headache persists or reoccurs. Do not take more than 2 tablets in 24 hours. 10 tablet 11  . Topiramate ER (TROKENDI XR) 100 MG CP24 Take 100 mg by mouth at bedtime. 30 capsule 11  . sertraline (ZOLOFT) 100 MG tablet Take 2 tablets (200 mg total) by mouth daily. 180 tablet 0   No current facility-administered medications for this visit.     Medication Side Effects: None  Allergies:  Allergies  Allergen  Reactions  . Wellbutrin [Bupropion]     Highly suspicious for causing headaches per pt     Past Medical History:  Diagnosis Date  . Depression   . Headache   . Migraines     Family History  Problem Relation Age of Onset  . Depression Father   . Bipolar disorder Father   . Suicidality Father   . Migraines Brother   . Depression  Maternal Grandmother     Social History   Socioeconomic History  . Marital status: Married    Spouse name: Not on file  . Number of children: 2  . Years of education: 77  . Highest education level: Not on file  Occupational History  . Occupation: Nanny  . Occupation: [place]  . Occupation: Ship broker    Comment: Business  Social Needs  . Financial resource strain: Not on file  . Food insecurity:    Worry: Not on file    Inability: Not on file  . Transportation needs:    Medical: Not on file    Non-medical: Not on file  Tobacco Use  . Smoking status: Never Smoker  . Smokeless tobacco: Never Used  Substance and Sexual Activity  . Alcohol use: No  . Drug use: No  . Sexual activity: Not on file  Lifestyle  . Physical activity:    Days per week: Not on file    Minutes per session: Not on file  . Stress: Not on file  Relationships  . Social connections:    Talks on phone: Not on file    Gets together: Not on file    Attends religious service: Not on file    Active member of club or organization: Not on file    Attends meetings of clubs or organizations: Not on file    Relationship status: Not on file  . Intimate partner violence:    Fear of current or ex partner: Not on file    Emotionally abused: Not on file    Physically abused: Not on file    Forced sexual activity: Not on file  Other Topics Concern  . Not on file  Social History Narrative  . Not on file    Past Medical History, Surgical history, Social history, and Family history were reviewed and updated as appropriate.   Please see review of systems for further details on the patient's review from today.   Objective:   Physical Exam:  BP 115/68   Pulse 71   Physical Exam  Constitutional: She is oriented to person, place, and time. She appears well-developed. No distress.  Musculoskeletal: She exhibits no deformity.  Neurological: She is alert and oriented to person, place, and time.  Coordination normal.  Psychiatric: Her speech is normal and behavior is normal. Judgment and thought content normal. Her mood appears anxious. Her affect is not angry, not blunt, not labile and not inappropriate. Cognition and memory are normal. She exhibits a depressed mood. She expresses no homicidal and no suicidal ideation. She expresses no suicidal plans and no homicidal plans.  Insight intact. No auditory or visual hallucinations. No delusions.     Lab Review:     Component Value Date/Time   NA 144 02/07/2016 0901   K 4.4 02/07/2016 0901   CL 103 02/07/2016 0901   CO2 21 02/07/2016 0901   GLUCOSE 90 02/07/2016 0901   BUN 8 02/07/2016 0901   CREATININE 0.68 02/07/2016 0901   CALCIUM 9.0 02/07/2016 0901  PROT 6.9 02/07/2016 0901   ALBUMIN 4.5 02/07/2016 0901   AST 24 02/07/2016 0901   ALT 34 (H) 02/07/2016 0901   ALKPHOS 97 02/07/2016 0901   BILITOT 0.3 02/07/2016 0901   GFRNONAA 115 02/07/2016 0901   GFRAA 133 02/07/2016 0901       Component Value Date/Time   WBC 5.8 02/07/2016 0901   RBC 4.19 02/07/2016 0901   HGB 13.2 02/07/2016 0901   HCT 38.9 02/07/2016 0901   PLT 219 02/07/2016 0901   MCV 93 02/07/2016 0901   MCH 31.5 02/07/2016 0901   MCHC 33.9 02/07/2016 0901   RDW 13.6 02/07/2016 0901    No results found for: POCLITH, LITHIUM   No results found for: PHENYTOIN, PHENOBARB, VALPROATE, CBMZ   .res Assessment: Plan:   Pt seen for 20 minutes and greater than 50% of session spent counseling pt re: potential benefits, risks, and side effects of increasing Sertraline to 200 mg po qd. Discussed that increased dose in Sertraline may be helpful for mood and anxiety. Discussed considering augmentation strategies if Sertraline 200 mg po qd is effective for her anxiety s/s and she continues to have some residual depressive s/s and low energy and low motivation. Will continue Lamotrigine for mood. Recommend continuing therapy with Lina Sayre, LPC. Prolonged  posttraumatic stress disorder - Plan: sertraline (ZOLOFT) 100 MG tablet  Mild mood disorder (HCC) - Plan: sertraline (ZOLOFT) 100 MG tablet, lamoTRIgine (LAMICTAL) 150 MG tablet  Mixed obsessional thoughts and acts - Plan: sertraline (ZOLOFT) 100 MG tablet  Please see After Visit Summary for patient specific instructions.  Future Appointments  Date Time Provider [place]  03/27/2018  8:30 AM Thayer Headings, PMHNP [facility] None    No orders of the defined types were placed in this encounter.     -------------------------------

## 2018-02-13 DIAGNOSIS — Z0000 Encounter for general adult medical examination without abnormal findings: Secondary | ICD-10-CM | POA: Diagnosis not present

## 2018-02-20 ENCOUNTER — Ambulatory Visit: Payer: BLUE CROSS/BLUE SHIELD | Admitting: Internal Medicine

## 2018-03-27 ENCOUNTER — Ambulatory Visit: Payer: BC Managed Care – PPO | Admitting: Psychiatry

## 2018-04-08 ENCOUNTER — Ambulatory Visit (INDEPENDENT_AMBULATORY_CARE_PROVIDER_SITE_OTHER): Payer: BLUE CROSS/BLUE SHIELD | Admitting: Psychiatry

## 2018-04-08 ENCOUNTER — Encounter: Payer: Self-pay | Admitting: Psychiatry

## 2018-04-08 VITALS — BP 117/70 | HR 71

## 2018-04-08 DIAGNOSIS — F900 Attention-deficit hyperactivity disorder, predominantly inattentive type: Principal | ICD-10-CM

## 2018-04-08 DIAGNOSIS — F4312 Post-traumatic stress disorder, chronic: Secondary | ICD-10-CM

## 2018-04-08 DIAGNOSIS — F422 Mixed obsessional thoughts and acts: Secondary | ICD-10-CM

## 2018-04-08 DIAGNOSIS — F39 Unspecified mood [affective] disorder: Secondary | ICD-10-CM

## 2018-04-08 DIAGNOSIS — F4010 Social phobia, unspecified: Secondary | ICD-10-CM

## 2018-04-08 MED ORDER — PROPRANOLOL HCL 10 MG PO TABS: ORAL_TABLET | ORAL | 1 refills | Status: DC

## 2018-04-08 MED ORDER — SERTRALINE HCL 100 MG PO TABS: 200.0000 mg | ORAL_TABLET | Freq: Every day | ORAL | 1 refills | Status: DC

## 2018-04-08 MED ORDER — METHYLPHENIDATE HCL ER (OSM) 27 MG PO TBCR: 27.0000 mg | EXTENDED_RELEASE_TABLET | ORAL | 0 refills | Status: DC

## 2018-04-08 NOTE — Progress Notes (Signed)
Tara Ferguson [id number] 1980/07/03 38 y.o.  Subjective:   Patient ID:  Tara Ferguson is a 38 y.o. (DOB October 23, 1980) female.  Chief Complaint:  Chief Complaint  Patient presents with  . Other    Concentration difficulties, inattention  . Depression    low energy, low motivation  . Anxiety    HPI Tara Ferguson presents to the office today for follow-up of anxiety and mood. She reports that Sertraline has helped with her anxiety. She notices some panic s/s and occasional panic attacks (1-2 times every 2 weeks). Panic attacks often occur without apparent trigger at times. Reports that she continues to have severe social anxiety and at times this is overwhelming. Feels that worry is better controlled. Reports significant improvement in intrusive thoughts with Sertraline 200 mg po qd. Reports that her energy remains lower than she would like. Motivation is also low. She reports that her concentration is not adequate. She reports that she is easily distracted. Also notices that she has to think about how to do things that she has done multiple times. She reports that her mood has been "a little bit better" and "I'm not as moody." She reports that at times she continues to "get frustrated really easy" and "have  A short fuse... but not as often." Reports that she continues to experience frequent sad mood. She reports that she sleeps well at night and sleeps about 8-9 hours a night. Denies nightmares.  Reports getting tired during the day and questions if this is a result of social anxiety. Reports appetite has been decreased and is unsure why. Reports she does not snack like she once did and "stress eating" may have decreased. Denies any recent risky behaviors. Reports no change in impulsive buying- "it's always been an issue for me." Denies any recent periods of elevated mood, increased energy, or decreased need for sleep. Denies SI.   Twins that they were fostering were adopted.   Reports family  h/o ADD. Questions if she may have ADD. Reports that she recalls not always paying attention in class. Reports long-standing difficulty completing tasks and frequently procrastinates. Reports that she generally does not have difficulty losing or misplacing things. Reports that she sometimes has the urge to interrupt and interject. Chronic difficulty keeping up with appointments and events and spouse frequently talks to her about forgetting appointments or scheduling two things at the same time. Notices she will "zone out" with tedious tasks. Reports that she will "zone out" at times while driving in familiar places. Avoids some tasks that are more detailed and not as interesting. Reports making frequent mistakes on projects that are boring. Mind frequently drifts during conversations.   Past medication trials: Prozac-no improvement Effexor-ineffective Sertraline- Effective for anxiety Wellbutrin-headaches, worsening irritability Latuda-ineffective, nausea, vomiting Vraylar Topamax-taken for headaches Lamictal- Ineffective Lithium Trileptal BuSpar  Review of Systems:  Review of Systems  Musculoskeletal: Negative for gait problem.  Neurological: Positive for headaches. Negative for tremors.       Reports daily HA's for the last week and this is unusual for her.   Psychiatric/Behavioral:       Please refer to HPI    Medications: I have reviewed the patient's current medications.  Current Outpatient Medications  Medication Sig Dispense Refill  . prochlorperazine (COMPAZINE) 10 MG tablet Take 1 tablet (10 mg total) by mouth every 6 (six) hours as needed for nausea or vomiting. 30 tablet 11  . sertraline (ZOLOFT) 100 MG tablet Take 2 tablets (200 mg total) by mouth  daily. 180 tablet 1  . SUMAtriptan (IMITREX) 100 MG tablet Take 1 tablet by mouth once as needed. May repeat in 2 hours if headache persists or reoccurs. Do not take more than 2 tablets in 24 hours. 10 tablet 11  . Topiramate ER  (TROKENDI XR) 100 MG CP24 Take 100 mg by mouth at bedtime. (Patient taking differently: Take 200 mg by mouth at bedtime. ) 30 capsule 11  . methylphenidate (CONCERTA) 27 MG PO CR tablet Take 1 tablet (27 mg total) by mouth every morning for 30 days. 30 tablet 0  . propranolol (INDERAL) 10 MG tablet Take 1-2 tabs po BID prn anxiety 120 tablet 1   No current facility-administered medications for this visit.     Medication Side Effects: None  Allergies:  Allergies  Allergen Reactions  . Wellbutrin [Bupropion]     Highly suspicious for causing headaches per pt     Past Medical History:  Diagnosis Date  . Depression   . Headache   . Migraines     Family History  Problem Relation Age of Onset  . Depression Father   . Bipolar disorder Father   . Suicidality Father   . ADD / ADHD Father   . Migraines Brother   . ADD / ADHD Brother   . Depression Maternal Grandmother     Social History   Socioeconomic History  . Marital status: Married    Spouse name: Not on file  . Number of children: 2  . Years of education: 69  . Highest education level: Not on file  Occupational History  . Occupation: Nanny  . Occupation: [employer]  . Occupation: Ship broker    Comment: Business  Social Needs  . Financial resource strain: Not on file  . Food insecurity:    Worry: Not on file    Inability: Not on file  . Transportation needs:    Medical: Not on file    Non-medical: Not on file  Tobacco Use  . Smoking status: Never Smoker  . Smokeless tobacco: Never Used  Substance and Sexual Activity  . Alcohol use: No  . Drug use: No  . Sexual activity: Not on file  Lifestyle  . Physical activity:    Days per week: Not on file    Minutes per session: Not on file  . Stress: Not on file  Relationships  . Social connections:    Talks on phone: Not on file    Gets together: Not on file    Attends religious service: Not on file    Active member of club or organization: Not  on file    Attends meetings of clubs or organizations: Not on file    Relationship status: Not on file  . Intimate partner violence:    Fear of current or ex partner: Not on file    Emotionally abused: Not on file    Physically abused: Not on file    Forced sexual activity: Not on file  Other Topics Concern  . Not on file  Social History Narrative  . Not on file    Past Medical History, Surgical history, Social history, and Family history were reviewed and updated as appropriate.   Please see review of systems for further details on the patient's review from today.   Objective:   Physical Exam:  BP 117/70   Pulse 71   Physical Exam Constitutional:      General: She is not in acute distress.  Appearance: She is well-developed.  Musculoskeletal:        General: No deformity.  Neurological:     Mental Status: She is alert and oriented to person, place, and time.     Coordination: Coordination normal.  Psychiatric:        Attention and Perception: Perception normal. She is inattentive. She does not perceive auditory or visual hallucinations.        Mood and Affect: Mood is anxious and depressed. Affect is blunt. Affect is not labile, angry or inappropriate.        Speech: Speech normal.        Behavior: Behavior is withdrawn.        Thought Content: Thought content normal. Thought content does not include homicidal or suicidal ideation. Thought content does not include homicidal or suicidal plan.        Cognition and Memory: Cognition and memory normal.        Judgment: Judgment normal.     Comments: Insight intact. No delusions.  Fidgeting on exam.      Lab Review:     Component Value Date/Time   NA 144 02/07/2016 0901   K 4.4 02/07/2016 0901   CL 103 02/07/2016 0901   CO2 21 02/07/2016 0901   GLUCOSE 90 02/07/2016 0901   BUN 8 02/07/2016 0901   CREATININE 0.68 02/07/2016 0901   CALCIUM 9.0 02/07/2016 0901   PROT 6.9 02/07/2016 0901   ALBUMIN 4.5 02/07/2016  0901   AST 24 02/07/2016 0901   ALT 34 (H) 02/07/2016 0901   ALKPHOS 97 02/07/2016 0901   BILITOT 0.3 02/07/2016 0901   GFRNONAA 115 02/07/2016 0901   GFRAA 133 02/07/2016 0901       Component Value Date/Time   WBC 5.8 02/07/2016 0901   RBC 4.19 02/07/2016 0901   HGB 13.2 02/07/2016 0901   HCT 38.9 02/07/2016 0901   PLT 219 02/07/2016 0901   MCV 93 02/07/2016 0901   MCH 31.5 02/07/2016 0901   MCHC 33.9 02/07/2016 0901   RDW 13.6 02/07/2016 0901    No results found for: POCLITH, LITHIUM   No results found for: PHENYTOIN, PHENOBARB, VALPROATE, CBMZ   .res Assessment: Plan:   Discussed signs and symptoms of attention deficit and possible treatment options. Discussed potential benefits, risks, and side effects of stimulants with patient to include increased heart rate, palpitations, insomnia, increased anxiety, increased irritability, or decreased appetite.  Instructed patient to contact office if experiencing any significant tolerability issues.  Recommended starting with methylphenidate based medication since these medications would be less likely to exacerbate anxiety, irritability, and headaches.  Discussed potential benefits, risks, and side effects of Concerta.  Discussed starting with lower dose and titrating to lowest possible effective dose.  Will start Concerta 27 mg p.o. every morning for attention deficit. Discussed potential benefits, risk, and side effects of propanolol as needed and discussed that this may be helpful for social anxiety and when panic signs and symptoms occur. Continue sertraline 200 mg daily since this has been helpful for her anxiety signs and symptoms. Patient advised to contact office with any questions, adverse effects, or acute worsening in signs and symptoms. Patient to follow-up in 4 weeks or sooner if clinically indicated.  Attention deficit hyperactivity disorder (ADHD), predominantly inattentive type - Plan: methylphenidate (CONCERTA) 27 MG PO  CR tablet  Prolonged posttraumatic stress disorder - Plan: sertraline (ZOLOFT) 100 MG tablet  Mild mood disorder (HCC) - Plan: sertraline (ZOLOFT) 100 MG tablet  Mixed  obsessional thoughts and acts - Plan: sertraline (ZOLOFT) 100 MG tablet  Social anxiety disorder - Plan: propranolol (INDERAL) 10 MG tablet  Please see After Visit Summary for patient specific instructions.  Future Appointments  Date Time Provider [place]  05/06/2018  8:30 AM Thayer Headings, PMHNP [facility] None    No orders of the defined types were placed in this encounter.     -------------------------------

## 2018-04-09 ENCOUNTER — Encounter: Payer: Self-pay | Admitting: Psychiatry

## 2018-04-09 DIAGNOSIS — J111 Influenza due to unidentified influenza virus with other respiratory manifestations: Secondary | ICD-10-CM | POA: Diagnosis not present

## 2018-04-09 DIAGNOSIS — R5383 Other fatigue: Secondary | ICD-10-CM | POA: Diagnosis not present

## 2018-04-09 DIAGNOSIS — R0602 Shortness of breath: Secondary | ICD-10-CM | POA: Diagnosis not present

## 2018-04-09 DIAGNOSIS — R05 Cough: Secondary | ICD-10-CM | POA: Diagnosis not present

## 2018-05-06 ENCOUNTER — Ambulatory Visit (INDEPENDENT_AMBULATORY_CARE_PROVIDER_SITE_OTHER): Payer: BLUE CROSS/BLUE SHIELD | Admitting: Psychiatry

## 2018-05-06 ENCOUNTER — Encounter: Payer: Self-pay | Admitting: Psychiatry

## 2018-05-06 VITALS — BP 120/76 | HR 66

## 2018-05-06 DIAGNOSIS — F900 Attention-deficit hyperactivity disorder, predominantly inattentive type: Principal | ICD-10-CM

## 2018-05-06 DIAGNOSIS — F4010 Social phobia, unspecified: Secondary | ICD-10-CM

## 2018-05-06 DIAGNOSIS — F422 Mixed obsessional thoughts and acts: Secondary | ICD-10-CM

## 2018-05-06 DIAGNOSIS — F4312 Post-traumatic stress disorder, chronic: Secondary | ICD-10-CM

## 2018-05-06 MED ORDER — METHYLPHENIDATE HCL ER (OSM) 36 MG PO TBCR: 36.0000 mg | EXTENDED_RELEASE_TABLET | ORAL | 0 refills | Status: DC

## 2018-05-06 NOTE — Progress Notes (Signed)
Tara Ferguson [id number] October 31, 1980 38 y.o.  Subjective:   Patient ID:  Tara Ferguson is a 38 y.o. (DOB [birth year]) female.  Chief Complaint:  Chief Complaint  Patient presents with  . ADD  . Anxiety  . Depression    HPI Tara Ferguson presents to the office today for follow-up of ADD, Depression, and anxiety. She reports that she can tell a positive difference with Concerta in terms of concentration and motivation. She reports that she can focus somewhat better but still notices frequent distractibility. She notices that her motivation is improved "for about half the day." For example, she recently started cleaning out her closet and pulled everything out and then "got really overwhelmed" and was unable to finish. Has not noticed a significant improvement in productivity at work. Denies any decrease in sleep and able to sleep through the night. She reports that she is not "quite as hungry" and is snacking less. "My anxiety is good." Denies any change in anxiety or irritability with start of Concerta. Denies any worsening obsessions or compulsions. Denies recent panic attacks. She reports mood "overall it's been pretty good." Reports motivation is not as good as she would like, even with recent improvement in motivation. She reports energy has "definitely been a little bit better." Denies SI.   Denies any risky or impulsive behaviors. She reports that she will "stress shop" when stressed or upset and this is regardless of mood state. Denies any change in spending.   Reports that she has not yet taken Propranolol prn because she has not been in a situation that would trigger social anxiety.   Past medication trials: Prozac-no improvement Effexor-ineffective Sertraline- Effective for anxiety Wellbutrin-headaches, worsening irritability Latuda-ineffective, nausea, vomiting Vraylar Topamax-taken for headaches Lamictal- Ineffective Lithium Trileptal BuSpar  Review of Systems:   Review of Systems  Cardiovascular: Negative for palpitations.  Musculoskeletal: Negative for gait problem.  Neurological: Positive for headaches. Negative for tremors.       Denies any change in chronic HA  Psychiatric/Behavioral:       Please refer to HPI    Medications: I have reviewed the patient's current medications.  Current Outpatient Medications  Medication Sig Dispense Refill  . methylphenidate 36 MG PO CR tablet Take 1 tablet (36 mg total) by mouth every morning for 30 days. 30 tablet 0  . prochlorperazine (COMPAZINE) 10 MG tablet Take 1 tablet (10 mg total) by mouth every 6 (six) hours as needed for nausea or vomiting. 30 tablet 11  . sertraline (ZOLOFT) 100 MG tablet Take 2 tablets (200 mg total) by mouth daily. 180 tablet 1  . SUMAtriptan (IMITREX) 100 MG tablet Take 1 tablet by mouth once as needed. May repeat in 2 hours if headache persists or reoccurs. Do not take more than 2 tablets in 24 hours. 10 tablet 11  . Topiramate ER (TROKENDI XR) 100 MG CP24 Take 100 mg by mouth at bedtime. (Patient taking differently: Take 200 mg by mouth at bedtime. ) 30 capsule 11  . propranolol (INDERAL) 10 MG tablet Take 1-2 tabs po BID prn anxiety (Patient not taking: Reported on 05/06/2018) 120 tablet 1   No current facility-administered medications for this visit.     Medication Side Effects: None  Allergies:  Allergies  Allergen Reactions  . Wellbutrin [Bupropion]     Highly suspicious for causing headaches per pt     Past Medical History:  Diagnosis Date  . Depression   . Headache   . Migraines  Family History  Problem Relation Age of Onset  . Depression Father   . Bipolar disorder Father   . Suicidality Father   . ADD / ADHD Father   . Migraines Brother   . ADD / ADHD Brother   . Depression Maternal Grandmother     Social History   Socioeconomic History  . Marital status: Married    Spouse name: Not on file  . Number of children: 2  . Years of education:  63  . Highest education level: Not on file  Occupational History  . Occupation: Nanny  . Occupation: [employer]  . Occupation: Ship broker    Comment: Business  Social Needs  . Financial resource strain: Not on file  . Food insecurity:    Worry: Not on file    Inability: Not on file  . Transportation needs:    Medical: Not on file    Non-medical: Not on file  Tobacco Use  . Smoking status: Never Smoker  . Smokeless tobacco: Never Used  Substance and Sexual Activity  . Alcohol use: No  . Drug use: No  . Sexual activity: Not on file  Lifestyle  . Physical activity:    Days per week: Not on file    Minutes per session: Not on file  . Stress: Not on file  Relationships  . Social connections:    Talks on phone: Not on file    Gets together: Not on file    Attends religious service: Not on file    Active member of club or organization: Not on file    Attends meetings of clubs or organizations: Not on file    Relationship status: Not on file  . Intimate partner violence:    Fear of current or ex partner: Not on file    Emotionally abused: Not on file    Physically abused: Not on file    Forced sexual activity: Not on file  Other Topics Concern  . Not on file  Social History Narrative  . Not on file    Past Medical History, Surgical history, Social history, and Family history were reviewed and updated as appropriate.   Please see review of systems for further details on the patient's review from today.   Objective:   Physical Exam:  BP 120/76   Pulse 66   Physical Exam Constitutional:      General: She is not in acute distress.    Appearance: She is well-developed.  Musculoskeletal:        General: No deformity.  Neurological:     Mental Status: She is alert and oriented to person, place, and time.     Coordination: Coordination normal.  Psychiatric:        Attention and Perception: Attention and perception normal. She does not perceive auditory  or visual hallucinations.        Mood and Affect: Mood is not anxious. Affect is not labile, blunt, angry or inappropriate.        Speech: Speech normal.        Behavior: Behavior normal.        Thought Content: Thought content normal. Thought content does not include homicidal or suicidal ideation. Thought content does not include homicidal or suicidal plan.        Cognition and Memory: Cognition and memory normal.        Judgment: Judgment normal.     Comments: Mood presents as less depressed and less anxious.  Affect  more appropriate-i.e., smiling at times. Insight intact. No delusions.      Lab Review:     Component Value Date/Time   NA 144 02/07/2016 0901   K 4.4 02/07/2016 0901   CL 103 02/07/2016 0901   CO2 21 02/07/2016 0901   GLUCOSE 90 02/07/2016 0901   BUN 8 02/07/2016 0901   CREATININE 0.68 02/07/2016 0901   CALCIUM 9.0 02/07/2016 0901   PROT 6.9 02/07/2016 0901   ALBUMIN 4.5 02/07/2016 0901   AST 24 02/07/2016 0901   ALT 34 (H) 02/07/2016 0901   ALKPHOS 97 02/07/2016 0901   BILITOT 0.3 02/07/2016 0901   GFRNONAA 115 02/07/2016 0901   GFRAA 133 02/07/2016 0901       Component Value Date/Time   WBC 5.8 02/07/2016 0901   RBC 4.19 02/07/2016 0901   HGB 13.2 02/07/2016 0901   HCT 38.9 02/07/2016 0901   PLT 219 02/07/2016 0901   MCV 93 02/07/2016 0901   MCH 31.5 02/07/2016 0901   MCHC 33.9 02/07/2016 0901   RDW 13.6 02/07/2016 0901    No results found for: POCLITH, LITHIUM   No results found for: PHENYTOIN, PHENOBARB, VALPROATE, CBMZ   .res Assessment: Plan:   Will increase Concerta to 36 mg p.o. every morning for attention deficit since patient describes partial response to Concerta 27 mg p.o. every morning with duration not lasting for longer than several hours.  Discussed considering switching to longer acting medication if 36 mg p.o. every morning is effective but duration is not long enough or may consider adding immediate release medication if  appropriate. Continue sertraline 200 mg daily for anxiety and depression since anxiety has improved significantly with sertraline and there has been some improvement in depressive signs and symptoms as well. Patient advised to contact office with any questions, adverse effects, or acute worsening in signs and symptoms. Patient to follow-up in 4 weeks or sooner if clinically indicated.   Attention deficit hyperactivity disorder (ADHD), predominantly inattentive type - Plan: methylphenidate 36 MG PO CR tablet  Social anxiety disorder  Prolonged posttraumatic stress disorder  Mixed obsessional thoughts and acts  Please see After Visit Summary for patient specific instructions.  Future Appointments  Date Time Provider [place]  06/04/2018  8:30 AM Thayer Headings, PMHNP [facility] None    No orders of the defined types were placed in this encounter.     -------------------------------

## 2018-06-04 ENCOUNTER — Ambulatory Visit: Payer: BLUE CROSS/BLUE SHIELD | Admitting: Psychiatry

## 2018-06-11 ENCOUNTER — Ambulatory Visit: Payer: BLUE CROSS/BLUE SHIELD | Admitting: Psychiatry

## 2018-06-17 ENCOUNTER — Telehealth: Payer: Self-pay | Admitting: Psychiatry

## 2018-06-17 DIAGNOSIS — F900 Attention-deficit hyperactivity disorder, predominantly inattentive type: Principal | ICD-10-CM

## 2018-06-17 MED ORDER — METHYLPHENIDATE HCL ER (OSM) 36 MG PO TBCR: 36.0000 mg | EXTENDED_RELEASE_TABLET | ORAL | 0 refills | Status: DC

## 2018-06-17 NOTE — Telephone Encounter (Signed)
Patient called and said that she needs a refill on concerta 36 mg cr tablet to the [facility] on [address]

## 2018-07-26 ENCOUNTER — Telehealth: Payer: Self-pay | Admitting: Psychiatry

## 2018-07-26 DIAGNOSIS — F900 Attention-deficit hyperactivity disorder, predominantly inattentive type: Secondary | ICD-10-CM

## 2018-07-26 MED ORDER — METHYLPHENIDATE HCL ER (OSM) 36 MG PO TBCR: 36.0000 mg | EXTENDED_RELEASE_TABLET | ORAL | 0 refills | Status: DC

## 2018-07-26 NOTE — Telephone Encounter (Signed)
Patient called and said she needs a refill on her concerta 36 mg. She has an appt on 5/29

## 2018-08-06 DIAGNOSIS — M25572 Pain in left ankle and joints of left foot: Secondary | ICD-10-CM | POA: Diagnosis not present

## 2018-08-06 DIAGNOSIS — M79672 Pain in left foot: Secondary | ICD-10-CM | POA: Diagnosis not present

## 2018-08-09 ENCOUNTER — Ambulatory Visit (INDEPENDENT_AMBULATORY_CARE_PROVIDER_SITE_OTHER): Payer: BLUE CROSS/BLUE SHIELD | Admitting: Psychiatry

## 2018-08-09 ENCOUNTER — Other Ambulatory Visit: Payer: Self-pay

## 2018-08-09 ENCOUNTER — Encounter: Payer: Self-pay | Admitting: Psychiatry

## 2018-08-09 DIAGNOSIS — F900 Attention-deficit hyperactivity disorder, predominantly inattentive type: Secondary | ICD-10-CM

## 2018-08-09 DIAGNOSIS — F422 Mixed obsessional thoughts and acts: Secondary | ICD-10-CM | POA: Diagnosis not present

## 2018-08-09 DIAGNOSIS — F4312 Post-traumatic stress disorder, chronic: Secondary | ICD-10-CM

## 2018-08-09 DIAGNOSIS — F39 Unspecified mood [affective] disorder: Secondary | ICD-10-CM

## 2018-08-09 MED ORDER — SERTRALINE HCL 100 MG PO TABS: 200.0000 mg | ORAL_TABLET | Freq: Every day | ORAL | 1 refills | Status: DC

## 2018-08-09 MED ORDER — METHYLPHENIDATE HCL ER (OSM) 54 MG PO TBCR: 54.0000 mg | EXTENDED_RELEASE_TABLET | ORAL | 0 refills | Status: DC

## 2018-08-09 NOTE — Progress Notes (Signed)
[name] [id number] 15-Apr-1980 38 y.o.  Virtual Visit via Telephone Note  I connected with@ on 08/09/18 at  8:30 AM EDT by telephone and verified that I am speaking with the correct person using two identifiers.   I discussed the limitations, risks, security and privacy concerns of performing an evaluation and management service by telephone and the availability of in person appointments. I also discussed with the patient that there may be a patient responsible charge related to this service. The patient expressed understanding and agreed to proceed.   I discussed the assessment and treatment plan with the patient. The patient was provided an opportunity to ask questions and all were answered. The patient agreed with the plan and demonstrated an understanding of the instructions.   The patient was advised to call back or seek an in-person evaluation if the symptoms worsen or if the condition fails to improve as anticipated.  I provided 25 minutes of non-face-to-face time during this encounter.  The patient was located at home.  The provider was located at [place].   Thayer Headings, PMHNP   Subjective:   Patient ID:  Tara Ferguson is a 38 y.o. (DOB [birth year]) female.  Chief Complaint:  Chief Complaint  Patient presents with  . ADD  . Anxiety  . Depression    HPI Tara Ferguson presents for follow-up of ADD, Depression, and anxiety. She reports some recent situational stress r/t the pandemic. She reports that her wife is a Education officer, museum and has been teaching remotely and home schooling their 2 children and they have 2 new foster children. Pt reports that she did not work for a few weeks at the start of the pandemic and has been working regularly on site since then.  She reports that she has had some anxiety in response to situational stressors. She reports that she had some panic s/s after learning that a co-worker tested positive for COVID. Reports that she  is having panic attacks at least once daily since learning about possible COVID exposure. She reports that she has had some increase in obsessive thoughts and compulsions related to sanitizing. She reports some catastrophic thinking recently related to thoughts of her family. Reports some intrusive thoughts. Reports constantly feeling tense- "I am always on alert."   She reports that her mood has been "pretty good." She reports that Concerta has been helpful for her concentration and productivity and that there is still some potential for improvement. Feels that Concerta is lasting throughout the day. "I feel like my depression is always there, like a cloud. I haven't felt really sad or down." She reports that her wife has noticed increased irritability and that she "goes from nice to yelling really quick." She denies extreme mood lability. She reports that energy and motivation has improved with Concerta. She reports waking up multiple times a night with "random dreams" and some nightmares. Denies difficulty with sleep initiation. She reports that her appetite is "hit or miss." Reports that she is gaining weight despite decreased food intake. Reports that she is eating snacks throughout the day instead of meals. Denies SI.   Has been seeing an EAP counselor through her work.   Past medication trials: Prozac-no improvement Effexor-ineffective Sertraline- Effective for anxiety Wellbutrin-headaches, worsening irritability Latuda-ineffective, nausea,vomiting Vraylar Topamax-taken for headaches Lamictal- Ineffective Lithium Trileptal BuSpar  Review of Systems:  Review of Systems  Cardiovascular: Negative for palpitations.  Musculoskeletal: Negative for gait problem.  Neurological: Negative for tremors.  Psychiatric/Behavioral:  Please refer to HPI    Medications: I have reviewed the patient's current medications.  Current Outpatient Medications  Medication Sig Dispense Refill  .  prochlorperazine (COMPAZINE) 10 MG tablet Take 1 tablet (10 mg total) by mouth every 6 (six) hours as needed for nausea or vomiting. 30 tablet 11  . SUMAtriptan (IMITREX) 100 MG tablet Take 1 tablet by mouth once as needed. May repeat in 2 hours if headache persists or reoccurs. Do not take more than 2 tablets in 24 hours. 10 tablet 11  . Topiramate ER (TROKENDI XR) 100 MG CP24 Take 100 mg by mouth at bedtime. (Patient taking differently: Take 200 mg by mouth at bedtime. ) 30 capsule 11  . methylphenidate (CONCERTA) 54 MG PO CR tablet Take 1 tablet (54 mg total) by mouth every morning for 30 days. 30 tablet 0  . propranolol (INDERAL) 10 MG tablet Take 1-2 tabs po BID prn anxiety (Patient not taking: Reported on 05/06/2018) 120 tablet 1  . sertraline (ZOLOFT) 100 MG tablet Take 2 tablets (200 mg total) by mouth daily. 180 tablet 1   No current facility-administered medications for this visit.     Medication Side Effects: Other: Vivid dreams  Allergies:  Allergies  Allergen Reactions  . Wellbutrin [Bupropion]     Highly suspicious for causing headaches per pt     Past Medical History:  Diagnosis Date  . Depression   . Headache   . Migraines     Family History  Problem Relation Age of Onset  . Depression Father   . Bipolar disorder Father   . Suicidality Father   . ADD / ADHD Father   . Migraines Brother   . ADD / ADHD Brother   . Depression Maternal Grandmother     Social History   Socioeconomic History  . Marital status: Married    Spouse name: Not on file  . Number of children: 2  . Years of education: 40  . Highest education level: Not on file  Occupational History  . Occupation: Nanny  . Occupation: [employer]  . Occupation: Ship broker    Comment: Business  Social Needs  . Financial resource strain: Not on file  . Food insecurity:    Worry: Not on file    Inability: Not on file  . Transportation needs:    Medical: Not on file    Non-medical: Not  on file  Tobacco Use  . Smoking status: Never Smoker  . Smokeless tobacco: Never Used  Substance and Sexual Activity  . Alcohol use: No  . Drug use: No  . Sexual activity: Not on file  Lifestyle  . Physical activity:    Days per week: Not on file    Minutes per session: Not on file  . Stress: Not on file  Relationships  . Social connections:    Talks on phone: Not on file    Gets together: Not on file    Attends religious service: Not on file    Active member of club or organization: Not on file    Attends meetings of clubs or organizations: Not on file    Relationship status: Not on file  . Intimate partner violence:    Fear of current or ex partner: Not on file    Emotionally abused: Not on file    Physically abused: Not on file    Forced sexual activity: Not on file  Other Topics Concern  . Not on file  Social History Narrative  . Not on file    Past Medical History, Surgical history, Social history, and Family history were reviewed and updated as appropriate.   Please see review of systems for further details on the patient's review from today.   Objective:   Physical Exam:  There were no vitals taken for this visit.  Physical Exam Neurological:     Mental Status: She is alert and oriented to person, place, and time.     Cranial Nerves: No dysarthria.  Psychiatric:        Attention and Perception: Attention normal.        Mood and Affect: Mood is anxious.        Speech: Speech normal.        Behavior: Behavior is cooperative.        Thought Content: Thought content normal. Thought content is not paranoid or delusional. Thought content does not include homicidal or suicidal ideation. Thought content does not include homicidal or suicidal plan.        Cognition and Memory: Cognition and memory normal.        Judgment: Judgment normal.     Lab Review:     Component Value Date/Time   NA 144 02/07/2016 0901   K 4.4 02/07/2016 0901   CL 103 02/07/2016 0901    CO2 21 02/07/2016 0901   GLUCOSE 90 02/07/2016 0901   BUN 8 02/07/2016 0901   CREATININE 0.68 02/07/2016 0901   CALCIUM 9.0 02/07/2016 0901   PROT 6.9 02/07/2016 0901   ALBUMIN 4.5 02/07/2016 0901   AST 24 02/07/2016 0901   ALT 34 (H) 02/07/2016 0901   ALKPHOS 97 02/07/2016 0901   BILITOT 0.3 02/07/2016 0901   GFRNONAA 115 02/07/2016 0901   GFRAA 133 02/07/2016 0901       Component Value Date/Time   WBC 5.8 02/07/2016 0901   RBC 4.19 02/07/2016 0901   HGB 13.2 02/07/2016 0901   HCT 38.9 02/07/2016 0901   PLT 219 02/07/2016 0901   MCV 93 02/07/2016 0901   MCH 31.5 02/07/2016 0901   MCHC 33.9 02/07/2016 0901   RDW 13.6 02/07/2016 0901    No results found for: POCLITH, LITHIUM   No results found for: PHENYTOIN, PHENOBARB, VALPROATE, CBMZ   .res Assessment: Plan:   Will increase Concerta to 54 mg po q am since pt reports that Concerta has been partially effective at lower doses. Will send in one script for Concerta 54 mg po q am and encouraged pt to contact office in several weeks to discuss is she is tolerating 54 mg or would prefer to resume 36 mg po q am. Will continue Sertraline 200 mg po qd for anxiety and depression. Addressed pt's reluctance to taking Propranolol and reviewed when and how she could take it if she decided to use it for recent increased anxiety and panic s/s.  Pt to f/u in 3 months or sooner if clinically indicated.  Recommend continuing to see EAP counselor. Patient advised to contact office with any questions, adverse effects, or acute worsening in signs and symptoms.  Attention deficit hyperactivity disorder (ADHD), predominantly inattentive type - Plan: methylphenidate (CONCERTA) 54 MG PO CR tablet  Prolonged posttraumatic stress disorder - Plan: sertraline (ZOLOFT) 100 MG tablet  Mild mood disorder (HCC) - Plan: sertraline (ZOLOFT) 100 MG tablet  Mixed obsessional thoughts and acts - Plan: sertraline (ZOLOFT) 100 MG tablet  Please see After  Visit Summary for patient specific instructions.  No future  appointments.  No orders of the defined types were placed in this encounter.     -------------------------------

## 2018-08-19 DIAGNOSIS — M25572 Pain in left ankle and joints of left foot: Secondary | ICD-10-CM | POA: Diagnosis not present

## 2018-08-19 DIAGNOSIS — M79672 Pain in left foot: Secondary | ICD-10-CM | POA: Diagnosis not present

## 2018-08-30 DIAGNOSIS — M79672 Pain in left foot: Secondary | ICD-10-CM | POA: Diagnosis not present

## 2018-09-05 DIAGNOSIS — M25572 Pain in left ankle and joints of left foot: Secondary | ICD-10-CM | POA: Diagnosis not present

## 2018-09-05 DIAGNOSIS — M79672 Pain in left foot: Secondary | ICD-10-CM | POA: Diagnosis not present

## 2018-09-24 ENCOUNTER — Telehealth: Payer: Self-pay | Admitting: Psychiatry

## 2018-09-24 DIAGNOSIS — F900 Attention-deficit hyperactivity disorder, predominantly inattentive type: Secondary | ICD-10-CM

## 2018-09-24 MED ORDER — METHYLPHENIDATE HCL ER (OSM) 54 MG PO TBCR: 54.0000 mg | EXTENDED_RELEASE_TABLET | ORAL | 0 refills | Status: DC

## 2018-09-24 NOTE — Telephone Encounter (Signed)
Patient called and said that she needs a refill on her concerta 54 mg to be sent to [facility] on [address]

## 2018-09-26 DIAGNOSIS — M25572 Pain in left ankle and joints of left foot: Secondary | ICD-10-CM | POA: Diagnosis not present

## 2018-09-26 DIAGNOSIS — M79672 Pain in left foot: Secondary | ICD-10-CM | POA: Diagnosis not present

## 2018-10-01 DIAGNOSIS — R221 Localized swelling, mass and lump, neck: Secondary | ICD-10-CM | POA: Diagnosis not present

## 2018-10-08 DIAGNOSIS — Z131 Encounter for screening for diabetes mellitus: Secondary | ICD-10-CM | POA: Diagnosis not present

## 2018-10-08 DIAGNOSIS — Z6837 Body mass index (BMI) 37.0-37.9, adult: Secondary | ICD-10-CM | POA: Diagnosis not present

## 2018-10-08 DIAGNOSIS — R5383 Other fatigue: Secondary | ICD-10-CM | POA: Diagnosis not present

## 2018-10-08 DIAGNOSIS — Z13220 Encounter for screening for lipoid disorders: Secondary | ICD-10-CM | POA: Diagnosis not present

## 2018-10-08 DIAGNOSIS — F329 Major depressive disorder, single episode, unspecified: Secondary | ICD-10-CM | POA: Diagnosis not present

## 2018-10-11 ENCOUNTER — Other Ambulatory Visit: Payer: Self-pay | Admitting: Family Medicine

## 2018-10-11 DIAGNOSIS — R5383 Other fatigue: Secondary | ICD-10-CM | POA: Diagnosis not present

## 2018-10-11 DIAGNOSIS — E049 Nontoxic goiter, unspecified: Secondary | ICD-10-CM | POA: Diagnosis not present

## 2018-10-11 DIAGNOSIS — R635 Abnormal weight gain: Secondary | ICD-10-CM | POA: Diagnosis not present

## 2018-10-11 DIAGNOSIS — K5900 Constipation, unspecified: Secondary | ICD-10-CM | POA: Diagnosis not present

## 2018-10-11 DIAGNOSIS — R221 Localized swelling, mass and lump, neck: Secondary | ICD-10-CM

## 2018-10-14 ENCOUNTER — Ambulatory Visit
Admission: RE | Admit: 2018-10-14 | Discharge: 2018-10-14 | Disposition: A | Discharge status: Home / Self Care | Payer: BC Managed Care – PPO | Source: Ambulatory Visit | Attending: Family Medicine | Admitting: Family Medicine

## 2018-10-14 DIAGNOSIS — R221 Localized swelling, mass and lump, neck: Secondary | ICD-10-CM

## 2018-10-14 DIAGNOSIS — E041 Nontoxic single thyroid nodule: Secondary | ICD-10-CM | POA: Diagnosis not present

## 2018-10-18 ENCOUNTER — Other Ambulatory Visit: Payer: Self-pay | Admitting: Family Medicine

## 2018-10-18 DIAGNOSIS — R1310 Dysphagia, unspecified: Secondary | ICD-10-CM

## 2018-10-18 DIAGNOSIS — E049 Nontoxic goiter, unspecified: Secondary | ICD-10-CM

## 2018-10-23 ENCOUNTER — Other Ambulatory Visit: Payer: Self-pay | Admitting: Family Medicine

## 2018-10-23 DIAGNOSIS — E049 Nontoxic goiter, unspecified: Secondary | ICD-10-CM

## 2018-10-23 DIAGNOSIS — Z20828 Contact with and (suspected) exposure to other viral communicable diseases: Secondary | ICD-10-CM | POA: Diagnosis not present

## 2018-10-23 DIAGNOSIS — Z6841 Body Mass Index (BMI) 40.0 and over, adult: Secondary | ICD-10-CM | POA: Diagnosis not present

## 2018-10-23 DIAGNOSIS — R1310 Dysphagia, unspecified: Secondary | ICD-10-CM

## 2018-10-24 DIAGNOSIS — Z20828 Contact with and (suspected) exposure to other viral communicable diseases: Secondary | ICD-10-CM | POA: Diagnosis not present

## 2018-11-08 ENCOUNTER — Other Ambulatory Visit: Payer: Self-pay | Admitting: Psychiatry

## 2018-11-08 DIAGNOSIS — F900 Attention-deficit hyperactivity disorder, predominantly inattentive type: Secondary | ICD-10-CM

## 2018-11-08 MED ORDER — METHYLPHENIDATE HCL ER (OSM) 54 MG PO TBCR: 54.0000 mg | EXTENDED_RELEASE_TABLET | ORAL | 0 refills | Status: DC

## 2018-11-08 NOTE — Telephone Encounter (Signed)
Patient need refill on Concerta to be sent to [facility] on [address] in [place] for a 90 day supply, patient scheduled appt., for 09/17

## 2018-11-14 ENCOUNTER — Ambulatory Visit
Admission: RE | Admit: 2018-11-14 | Discharge: 2018-11-14 | Disposition: A | Discharge status: Home / Self Care | Payer: BC Managed Care – PPO | Source: Ambulatory Visit | Attending: Family Medicine | Admitting: Family Medicine

## 2018-11-14 ENCOUNTER — Other Ambulatory Visit (HOSPITAL_COMMUNITY)
Admission: RE | Admit: 2018-11-14 | Discharge: 2018-11-14 | Disposition: A | Discharge status: Home / Self Care | Payer: BC Managed Care – PPO | Source: Ambulatory Visit | Attending: Radiology | Admitting: Radiology

## 2018-11-14 DIAGNOSIS — E049 Nontoxic goiter, unspecified: Secondary | ICD-10-CM

## 2018-11-14 DIAGNOSIS — R1310 Dysphagia, unspecified: Secondary | ICD-10-CM

## 2018-11-14 DIAGNOSIS — E042 Nontoxic multinodular goiter: Secondary | ICD-10-CM | POA: Diagnosis not present

## 2018-11-20 ENCOUNTER — Other Ambulatory Visit: Payer: BLUE CROSS/BLUE SHIELD

## 2018-11-28 ENCOUNTER — Ambulatory Visit: Payer: BLUE CROSS/BLUE SHIELD | Admitting: Psychiatry

## 2018-12-09 ENCOUNTER — Other Ambulatory Visit: Payer: Self-pay

## 2018-12-09 ENCOUNTER — Ambulatory Visit: Payer: Self-pay | Admitting: Psychiatry

## 2018-12-09 ENCOUNTER — Ambulatory Visit: Payer: BC Managed Care – PPO | Admitting: Internal Medicine

## 2018-12-09 DIAGNOSIS — E669 Obesity, unspecified: Secondary | ICD-10-CM | POA: Diagnosis not present

## 2018-12-11 ENCOUNTER — Ambulatory Visit: Payer: BC Managed Care – PPO | Admitting: Internal Medicine

## 2018-12-11 ENCOUNTER — Encounter: Payer: Self-pay | Admitting: Internal Medicine

## 2018-12-11 ENCOUNTER — Other Ambulatory Visit: Payer: Self-pay

## 2018-12-11 VITALS — BP 122/68 | HR 64 | Temp 98.2°F | Ht 70.0 in | Wt 273.8 lb

## 2018-12-11 DIAGNOSIS — C73 Malignant neoplasm of thyroid gland: Secondary | ICD-10-CM

## 2018-12-11 DIAGNOSIS — E042 Nontoxic multinodular goiter: Secondary | ICD-10-CM | POA: Diagnosis not present

## 2018-12-11 DIAGNOSIS — E049 Nontoxic goiter, unspecified: Secondary | ICD-10-CM | POA: Diagnosis not present

## 2018-12-11 NOTE — Patient Instructions (Signed)
-   Please schedule an appointment with [place] at 6 weeks after surgery.

## 2018-12-11 NOTE — Progress Notes (Signed)
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 female with a past medical history. The patient presented for initial endocrinology clinic visit on 12/11/2018 for consultative assistance with her Papillary Thyroid Carcinoma .   Pt noted a palpable abnormality around the right side of her neck area sometime around 10/2018 which prompted a thyroid ultrasound showing multiple thyroid nodules. Two nodules met criteria for FNA. 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 Depression   . Headache   . Migraines    Past Surgical History:  Past Surgical History:  Procedure Laterality Date  . BACK SURGERY        Social History:  reports that she has never smoked. She has never used smokeless tobacco. She reports that she does not drink alcohol or use drugs.  Family History: family history includes ADD / ADHD in her brother and father; Bipolar disorder in her father; Depression in her father and maternal grandmother; Migraines in her brother; Suicidality in her father.   HOME MEDICATIONS: Allergies as of 12/11/2018      Reactions   Wellbutrin [bupropion]    Highly suspicious for causing headaches per pt       Medication List       Accurate as of December 11, 2018  4:06  PM. If you have any questions, ask your nurse or doctor.        STOP taking these medications   propranolol 10 MG tablet Commonly known as: INDERAL Stopped by: Dorita Sciara, MD   SUMAtriptan 100 MG tablet Commonly known as: IMITREX Stopped by: Dorita Sciara, MD   Topiramate ER 100 MG Cp24 Commonly known as: Trokendi XR Stopped by: Dorita Sciara, MD     TAKE these medications   methylphenidate 54 MG CR tablet Commonly known as: Concerta Take 1 tablet (54 mg total) by mouth every morning.   phentermine 37.5 MG tablet Commonly known as: ADIPEX-P TAKE 1 TABLET BY MOUTH ONCE DAILY FOR 30 DAYS   prochlorperazine 10 MG tablet Commonly known as: Compazine Take 1 tablet (10 mg total) by mouth every 6 (six) hours as needed for nausea or vomiting.   sertraline 100 MG tablet Commonly known as: Zoloft Take 2 tablets (200 mg total) by mouth daily. What changed: how much to take         REVIEW OF SYSTEMS: A comprehensive ROS was conducted with the patient and is negative except as per HPI and below:  Review of Systems  Constitutional: Positive for weight loss. Negative for fever.  HENT: Negative for congestion and sore throat.   Cardiovascular: Negative for chest pain and palpitations.  Gastrointestinal: Negative for diarrhea and nausea.  Neurological: Positive for headaches. Negative for tingling.  Psychiatric/Behavioral: Negative for depression. The patient is nervous/anxious.        OBJECTIVE:  VS: BP 122/68 (BP Location: Left Arm, Patient Position: Sitting, Cuff Size: Large)   Pulse 64   Temp 98.2 F (36.8 C)   Ht 5' 10" (1.778 m)   Wt 273 lb 12.8 oz (124.2 kg)   SpO2 99%   BMI 39.29 kg/m    Wt Readings from Last 3 Encounters:  12/11/18 273 lb 12.8 oz (124.2 kg)  08/15/17 267 lb (121.1 kg)  05/09/16 258 lb 3.2 oz (117.1 kg)     EXAM: General: Pt appears well and is in NAD  Hydration: Well-hydrated with moist mucous membranes and good  skin turgor  Eyes: External eye exam normal without stare, lid lag or exophthalmos.  EOM intact.  Ears, Nose, Throat: Hearing: Grossly intact bilaterally Dental: Good dentition  Throat: Clear without mass, erythema or exudate  Neck: General: Supple without adenopathy. Thyroid: Thyroid size enlarged.  Right thyroid  nodule appreciated. .  Lungs: Clear with good BS bilat with no rales, rhonchi, or wheezes  Heart: Auscultation: RRR.  Abdomen: Normoactive bowel sounds, soft, nontender, without masses or organomegaly palpable  Extremities:  BL LE: No pretibial edema normal ROM and strength.  Skin: Hair: Texture and amount normal with gender appropriate distribution Skin Inspection: No rashes Skin Palpation: Skin temperature, texture, and thickness normal to palpation  Neuro: Cranial nerves: II - XII grossly intact  Motor: Normal strength throughout DTRs: 2+ and symmetric in UE without delay in relaxation phase  Mental Status: Judgment, insight: Intact Orientation: Oriented to time, place, and person Mood and affect: No depression, anxiety, or agitation     DATA REVIEWED: 10/08/2018  TSH 1.7  T4 8.1 ug/dL   FNA Right  11/14/2018 THYROID, FINE NEEDLE ASPIRATION RIGHT LOWER POLE (SPECIMEN 1 OF 2 COLLECTED 11/14/2018) CONSISTENT WITH BENIGN FOLLICULAR NODULE (BETHESDA CATEGORY II).  FNA Left 11/14/2018 THYROID, FINE NEEDLE ASPIRATION, LEFT (SPECIMEN 2 OF 2 COLLECTED 11/14/2018) FINDINGS CONSISTENT WITH PAPILLARY CARCINOMA (BETHESDA CATEGORY VI).  ASSESSMENT/PLAN/RECOMMENDATIONS:   1. Papillary Thyroid Carcinoma:   - Pt with local neck symptoms  - She saw Dr. Delana Meyer today and is pending total thyroidectomy - Pt advised to follow up at our office in 6 weeks Post-op     Signed electronically by: Mack Guise, MD  [facility]  [facility] [address] [place], [place] [address] Phone: [phone] FAX: [phone]   CC: Kathyrn Lass,  [place] [address] Phone: [phone] Fax: [phone]   Return to Endocrinology clinic as below: Future Appointments  Date Time Provider [place]  02/03/2019  3:40 PM Shamleffer, Melanie Crazier, MD [facility] None

## 2018-12-12 NOTE — Progress Notes (Signed)
Need orders for upcoming surgery on 10-13

## 2018-12-13 ENCOUNTER — Ambulatory Visit: Payer: Self-pay | Admitting: Surgery

## 2018-12-13 NOTE — H&P (View-Only) (Signed)
General Surgery [facility]  Tara Ferguson DOB: 08/16/1980 Married / Language: English / Race: White Female   History of Present Illness  The patient is a 38 year old female who presents with thyroid cancer.  CHIEF COMPLAINT: papillary thyroid carcinoma  Patient is referred by Dr. Kathyrn Lass for surgical evaluation and management of papillary thyroid carcinoma. Patient has a known history of thyroid nodules dating back approximately 7 years. She was initially evaluated with ultrasound. She did not have any sequential follow-up. Recently the patient has had some issues with anxiety. As part of her evaluation, thyroid function test were obtained. These showed a normal TSH level of 1.73. Patient then underwent an ultrasound examination of the thyroid on October 14, 2018. This showed an enlarged thyroid gland with the right lobe measuring 6.8 cm and left lobe measuring 6.5 cm. There were bilateral thyroid nodules. Dominant nodule on the right was biopsied and found to be benign. Nodule on the left measuring 1.3 cm and located inferiorly had microcalcifications and was felt to be highly suspicious. Fine-needle aspiration biopsy was positive for papillary thyroid carcinoma. Patient is now referred for consideration for thyroidectomy. Patient has had no prior thyroid surgery. She has had no prior head or neck surgery. She has never been on thyroid medication. There is no family history of thyroid disease and no family history of thyroid cancer. Patient has noted mild solid food dysphagia. Otherwise she has had no compressive symptoms. She denies tremors or palpitations. She presents today for consideration for thyroidectomy for management of papillary thyroid carcinoma. She is scheduled to see Dr. Vivia Ewing later this afternoon in consultation.   Past Surgical History Spinal Surgery - Lower Back   Diagnostic Studies History Colonoscopy   never Mammogram  >3 years ago Pap Smear  1-5 years ago  Allergies Adhesive Tape  Allergies Reconciled   Medication History  Concerta (54MG  Tablet ER, Oral) Active. Sertraline HCl (50MG  Tablet, Oral two times daily) Active. Phentermine HCl (37.5MG  Tablet, Oral) Active. Medications Reconciled  Social History  Alcohol use  Occasional alcohol use. Caffeine use  Carbonated beverages. No drug use  Tobacco use  Never smoker.  Family History Alcohol Abuse  Brother. Depression  Brother, Father. Migraine Headache  Brother.  Pregnancy / Birth History Age at menarche  59 years. Gravida  0 Para  0 Regular periods   Other Problems  Anxiety Disorder  Back Pain  Bladder Problems  Depression  Migraine Headache  Thyroid Cancer  Thyroid Disease   Review of Systems  General Present- Fatigue and Weight Gain. Not Present- Appetite Loss, Chills, Fever, Night Sweats and Weight Loss. Skin Present- Dryness. Not Present- Change in Wart/Mole, Hives, Jaundice, New Lesions, Non-Healing Wounds, Rash and Ulcer. HEENT Present- Hoarseness and Wears glasses/contact lenses. Not Present- Earache, Hearing Loss, Nose Bleed, Oral Ulcers, Ringing in the Ears, Seasonal Allergies, Sinus Pain, Sore Throat, Visual Disturbances and Yellow Eyes. Respiratory Not Present- Bloody sputum, Chronic Cough, Difficulty Breathing, Snoring and Wheezing. Breast Not Present- Breast Mass, Breast Pain, Nipple Discharge and Skin Changes. Cardiovascular Not Present- Chest Pain, Difficulty Breathing Lying Down, Leg Cramps, Palpitations, Rapid Heart Rate, Shortness of Breath and Swelling of Extremities. Gastrointestinal Present- Change in Bowel Habits and Difficulty Swallowing. Not Present- Abdominal Pain, Bloating, Bloody Stool, Chronic diarrhea, Constipation, Excessive gas, Gets full quickly at meals, Hemorrhoids, Indigestion, Nausea, Rectal Pain and Vomiting. Female Genitourinary Present- Urgency. Not  Present- Frequency, Nocturia, Painful Urination and Pelvic Pain. Musculoskeletal Present- Back Pain. Not  Present- Joint Pain, Joint Stiffness, Muscle Pain, Muscle Weakness and Swelling of Extremities. Neurological Present- Headaches and Tingling. Not Present- Decreased Memory, Fainting, Numbness, Seizures, Tremor, Trouble walking and Weakness. Psychiatric Present- Anxiety, Bipolar and Depression. Not Present- Change in Sleep Pattern, Fearful and Frequent crying. Endocrine Present- Hot flashes. Not Present- Cold Intolerance, Excessive Hunger, Hair Changes, Heat Intolerance and New Diabetes. Hematology Not Present- Blood Thinners, Easy Bruising, Excessive bleeding, Gland problems, HIV and Persistent Infections.  Vitals Weight: 274.2 lb Height: 70in Body Surface Area: 2.39 m Body Mass Index: 39.34 kg/m  Temp.: 97.34F  Pulse: 93 (Regular)  BP: 128/86(Sitting, Left Arm, Standard)  Physical Exam  See vital signs recorded above  GENERAL APPEARANCE Development: normal Nutritional status: normal Gross deformities: none  SKIN Rash, lesions, ulcers: none Induration, erythema: none Nodules: none palpable  EYES Conjunctiva and lids: normal Pupils: equal and reactive Iris: normal bilaterally  EARS, NOSE, MOUTH, THROAT External ears: no lesion or deformity External nose: no lesion or deformity Hearing: grossly normal Patient is wearing a mask.  NECK Symmetric: yes Trachea: midline Thyroid: Palpation of the right thyroid lobe shows a dominant mass located in the inferior portion of the lobe. This is rounded, relatively firm, approximately 3 cm in greatest dimension. Palpation of the left thyroid lobe shows it to be slightly nodular without discrete or dominant mass. There does not appear to be any associated lymphadenopathy. There is no tenderness. Voice quality is normal.  CHEST Respiratory effort: normal Retraction or accessory muscle use: no Breath sounds: normal  bilaterally Rales, rhonchi, wheeze: none  CARDIOVASCULAR Auscultation: regular rhythm, normal rate Murmurs: none Pulses: carotid and radial pulse 2+ palpable Lower extremity edema: none Lower extremity varicosities: none  MUSCULOSKELETAL Station and gait: normal Digits and nails: no clubbing or cyanosis Muscle strength: grossly normal all extremities Range of motion: grossly normal all extremities Deformity: none  LYMPHATIC Cervical: none palpable Supraclavicular: none palpable  PSYCHIATRIC Oriented to person, place, and time: yes Mood and affect: normal for situation Judgment and insight: appropriate for situation    Assessment & Plan   PAPILLARY THYROID CARCINOMA (C73) MULTIPLE THYROID NODULES (E04.2) ENLARGED THYROID (E04.9)  Patient is referred by her primary care physician for surgical management of papillary thyroid carcinoma. Patient is provided with written literature on thyroid surgery to review at home.  Patient has a small papillary thyroid carcinoma arising in the left thyroid lobe. She does have a multinodular goiter. I have recommended proceeding with total thyroidectomy for definitive diagnosis and management. We discussed the risk and benefits of the procedure including the risk of recurrent laryngeal nerve injury and injury to parathyroid glands. We discussed the location of the surgical incision. We discussed the hospital stay to be anticipated. We discussed the postoperative recovery and return to work and activities. We discussed the need for lifelong thyroid hormone replacement. We discussed the potential need for radioactive iodine treatment. Patient understands and wishes to proceed with surgery in the near future.  Patient is being seen this afternoon by endocrinology. They will assist with management of thyroid hormone replacement and with decision making regarding radioactive iodine treatment.  The risks and benefits of the procedure have  been discussed at length with the patient. The patient understands the proposed procedure, potential alternative treatments, and the course of recovery to be expected. All of the patient's questions have been answered at this time. The patient wishes to proceed with surgery.  Armandina Gemma, [place] [facility] Office: [phone]

## 2018-12-13 NOTE — H&P (Signed)
General Surgery [facility]  Tara Ferguson DOB: May 30, 1980 Married / Language: English / Race: White Female   History of Present Illness  The patient is a 38 year old female who presents with thyroid cancer.  CHIEF COMPLAINT: papillary thyroid carcinoma  Patient is referred by Dr. Kathyrn Lass for surgical evaluation and management of papillary thyroid carcinoma. Patient has a known history of thyroid nodules dating back approximately 7 years. She was initially evaluated with ultrasound. She did not have any sequential follow-up. Recently the patient has had some issues with anxiety. As part of her evaluation, thyroid function test were obtained. These showed a normal TSH level of 1.73. Patient then underwent an ultrasound examination of the thyroid on October 14, 2018. This showed an enlarged thyroid gland with the right lobe measuring 6.8 cm and left lobe measuring 6.5 cm. There were bilateral thyroid nodules. Dominant nodule on the right was biopsied and found to be benign. Nodule on the left measuring 1.3 cm and located inferiorly had microcalcifications and was felt to be highly suspicious. Fine-needle aspiration biopsy was positive for papillary thyroid carcinoma. Patient is now referred for consideration for thyroidectomy. Patient has had no prior thyroid surgery. She has had no prior head or neck surgery. She has never been on thyroid medication. There is no family history of thyroid disease and no family history of thyroid cancer. Patient has noted mild solid food dysphagia. Otherwise she has had no compressive symptoms. She denies tremors or palpitations. She presents today for consideration for thyroidectomy for management of papillary thyroid carcinoma. She is scheduled to see Dr. Vivia Ewing later this afternoon in consultation.   Past Surgical History Spinal Surgery - Lower Back   Diagnostic Studies History Colonoscopy   never Mammogram  >3 years ago Pap Smear  1-5 years ago  Allergies Adhesive Tape  Allergies Reconciled   Medication History  Concerta (54MG  Tablet ER, Oral) Active. Sertraline HCl (50MG  Tablet, Oral two times daily) Active. Phentermine HCl (37.5MG  Tablet, Oral) Active. Medications Reconciled  Social History  Alcohol use  Occasional alcohol use. Caffeine use  Carbonated beverages. No drug use  Tobacco use  Never smoker.  Family History Alcohol Abuse  Brother. Depression  Brother, Father. Migraine Headache  Brother.  Pregnancy / Birth History Age at menarche  62 years. Gravida  0 Para  0 Regular periods   Other Problems  Anxiety Disorder  Back Pain  Bladder Problems  Depression  Migraine Headache  Thyroid Cancer  Thyroid Disease   Review of Systems  General Present- Fatigue and Weight Gain. Not Present- Appetite Loss, Chills, Fever, Night Sweats and Weight Loss. Skin Present- Dryness. Not Present- Change in Wart/Mole, Hives, Jaundice, New Lesions, Non-Healing Wounds, Rash and Ulcer. HEENT Present- Hoarseness and Wears glasses/contact lenses. Not Present- Earache, Hearing Loss, Nose Bleed, Oral Ulcers, Ringing in the Ears, Seasonal Allergies, Sinus Pain, Sore Throat, Visual Disturbances and Yellow Eyes. Respiratory Not Present- Bloody sputum, Chronic Cough, Difficulty Breathing, Snoring and Wheezing. Breast Not Present- Breast Mass, Breast Pain, Nipple Discharge and Skin Changes. Cardiovascular Not Present- Chest Pain, Difficulty Breathing Lying Down, Leg Cramps, Palpitations, Rapid Heart Rate, Shortness of Breath and Swelling of Extremities. Gastrointestinal Present- Change in Bowel Habits and Difficulty Swallowing. Not Present- Abdominal Pain, Bloating, Bloody Stool, Chronic diarrhea, Constipation, Excessive gas, Gets full quickly at meals, Hemorrhoids, Indigestion, Nausea, Rectal Pain and Vomiting. Female Genitourinary Present- Urgency. Not  Present- Frequency, Nocturia, Painful Urination and Pelvic Pain. Musculoskeletal Present- Back Pain. Not  Present- Joint Pain, Joint Stiffness, Muscle Pain, Muscle Weakness and Swelling of Extremities. Neurological Present- Headaches and Tingling. Not Present- Decreased Memory, Fainting, Numbness, Seizures, Tremor, Trouble walking and Weakness. Psychiatric Present- Anxiety, Bipolar and Depression. Not Present- Change in Sleep Pattern, Fearful and Frequent crying. Endocrine Present- Hot flashes. Not Present- Cold Intolerance, Excessive Hunger, Hair Changes, Heat Intolerance and New Diabetes. Hematology Not Present- Blood Thinners, Easy Bruising, Excessive bleeding, Gland problems, HIV and Persistent Infections.  Vitals Weight: 274.2 lb Height: 70in Body Surface Area: 2.39 m Body Mass Index: 39.34 kg/m  Temp.: 97.44F  Pulse: 93 (Regular)  BP: 128/86(Sitting, Left Arm, Standard)  Physical Exam  See vital signs recorded above  GENERAL APPEARANCE Development: normal Nutritional status: normal Gross deformities: none  SKIN Rash, lesions, ulcers: none Induration, erythema: none Nodules: none palpable  EYES Conjunctiva and lids: normal Pupils: equal and reactive Iris: normal bilaterally  EARS, NOSE, MOUTH, THROAT External ears: no lesion or deformity External nose: no lesion or deformity Hearing: grossly normal Patient is wearing a mask.  NECK Symmetric: yes Trachea: midline Thyroid: Palpation of the right thyroid lobe shows a dominant mass located in the inferior portion of the lobe. This is rounded, relatively firm, approximately 3 cm in greatest dimension. Palpation of the left thyroid lobe shows it to be slightly nodular without discrete or dominant mass. There does not appear to be any associated lymphadenopathy. There is no tenderness. Voice quality is normal.  CHEST Respiratory effort: normal Retraction or accessory muscle use: no Breath sounds: normal  bilaterally Rales, rhonchi, wheeze: none  CARDIOVASCULAR Auscultation: regular rhythm, normal rate Murmurs: none Pulses: carotid and radial pulse 2+ palpable Lower extremity edema: none Lower extremity varicosities: none  MUSCULOSKELETAL Station and gait: normal Digits and nails: no clubbing or cyanosis Muscle strength: grossly normal all extremities Range of motion: grossly normal all extremities Deformity: none  LYMPHATIC Cervical: none palpable Supraclavicular: none palpable  PSYCHIATRIC Oriented to person, place, and time: yes Mood and affect: normal for situation Judgment and insight: appropriate for situation    Assessment & Plan   PAPILLARY THYROID CARCINOMA (C73) MULTIPLE THYROID NODULES (E04.2) ENLARGED THYROID (E04.9)  Patient is referred by her primary care physician for surgical management of papillary thyroid carcinoma. Patient is provided with written literature on thyroid surgery to review at home.  Patient has a small papillary thyroid carcinoma arising in the left thyroid lobe. She does have a multinodular goiter. I have recommended proceeding with total thyroidectomy for definitive diagnosis and management. We discussed the risk and benefits of the procedure including the risk of recurrent laryngeal nerve injury and injury to parathyroid glands. We discussed the location of the surgical incision. We discussed the hospital stay to be anticipated. We discussed the postoperative recovery and return to work and activities. We discussed the need for lifelong thyroid hormone replacement. We discussed the potential need for radioactive iodine treatment. Patient understands and wishes to proceed with surgery in the near future.  Patient is being seen this afternoon by endocrinology. They will assist with management of thyroid hormone replacement and with decision making regarding radioactive iodine treatment.  The risks and benefits of the procedure have  been discussed at length with the patient. The patient understands the proposed procedure, potential alternative treatments, and the course of recovery to be expected. All of the patient's questions have been answered at this time. The patient wishes to proceed with surgery.  Armandina Gemma, [place] [facility] Office: [phone]

## 2018-12-19 ENCOUNTER — Encounter (HOSPITAL_COMMUNITY): Payer: Self-pay

## 2018-12-20 ENCOUNTER — Other Ambulatory Visit (HOSPITAL_COMMUNITY)
Admission: RE | Admit: 2018-12-20 | Discharge: 2018-12-20 | Disposition: A | Discharge status: Home / Self Care | Payer: BC Managed Care – PPO | Source: Ambulatory Visit | Attending: Surgery | Admitting: Surgery

## 2018-12-20 ENCOUNTER — Encounter (HOSPITAL_COMMUNITY): Payer: Self-pay | Admitting: *Deleted

## 2018-12-20 ENCOUNTER — Encounter (HOSPITAL_COMMUNITY): Payer: Self-pay

## 2018-12-20 ENCOUNTER — Ambulatory Visit (HOSPITAL_COMMUNITY)
Admission: RE | Admit: 2018-12-20 | Discharge: 2018-12-20 | Disposition: A | Discharge status: Home / Self Care | Payer: BC Managed Care – PPO | Source: Ambulatory Visit | Attending: Anesthesiology | Admitting: Anesthesiology

## 2018-12-20 ENCOUNTER — Other Ambulatory Visit: Payer: Self-pay

## 2018-12-20 ENCOUNTER — Encounter (HOSPITAL_COMMUNITY)
Admission: RE | Admit: 2018-12-20 | Discharge: 2018-12-20 | Disposition: A | Discharge status: Home / Self Care | Payer: BC Managed Care – PPO | Source: Ambulatory Visit | Attending: Surgery | Admitting: Surgery

## 2018-12-20 DIAGNOSIS — Z01818 Encounter for other preprocedural examination: Secondary | ICD-10-CM

## 2018-12-20 HISTORY — DX: Generalized hyperhidrosis: R61

## 2018-12-20 HISTORY — DX: Malignant neoplasm of thyroid gland: C73

## 2018-12-20 HISTORY — DX: Pneumonia, unspecified organism: J18.9

## 2018-12-20 HISTORY — DX: Other specified postprocedural states: Z98.890

## 2018-12-20 HISTORY — DX: Nausea with vomiting, unspecified: R11.2

## 2018-12-20 HISTORY — DX: Nontoxic multinodular goiter: E04.2

## 2018-12-20 HISTORY — DX: Carpal tunnel syndrome, bilateral upper limbs: G56.03

## 2018-12-20 LAB — CBC
HCT: 40.1 % (ref 36.0–46.0)
Hemoglobin: 13.4 g/dL (ref 12.0–15.0)
MCH: 31.2 pg (ref 26.0–34.0)
MCHC: 33.4 g/dL (ref 30.0–36.0)
MCV: 93.3 fL (ref 80.0–100.0)
Platelets: 229 10*3/uL (ref 150–400)
RBC: 4.3 MIL/uL (ref 3.87–5.11)
RDW: 14 % (ref 11.5–15.5)
WBC: 5.4 10*3/uL (ref 4.0–10.5)
nRBC: 0 % (ref 0.0–0.2)

## 2018-12-20 NOTE — Progress Notes (Signed)
SPOKE W/  Shariya     SCREENING SYMPTOMS OF COVID 19:   COUGH--NO  RUNNY NOSE--- NO  SORE THROAT---NO  NASAL CONGESTION----NO  SNEEZING----NO  SHORTNESS OF BREATH---NO  DIFFICULTY BREATHING---NO  TEMP >100.0 -----NO  UNEXPLAINED BODY ACHES------NO  CHILLS -------- NO  HEADACHES ---------NO  LOSS OF SMELL/ TASTE --------NO    HAVE YOU OR ANY FAMILY MEMBER TRAVELLED PAST 14 DAYS OUT OF THE   COUNTY---Travelled to [place] STATE----Travelled to [place] COUNTRY----NO  HAVE YOU OR ANY FAMILY MEMBER BEEN EXPOSED TO ANYONE WITH COVID 19? NO

## 2018-12-20 NOTE — Progress Notes (Signed)
PCP - Dr. Kathyrn Lass Cardiologist - N/A  Chest x-ray - 12/20/2018 in epic EKG -N/A  Stress Test - N/A ECHO - N/A Cardiac Cath - N/A  Sleep Study - 02/29/2016 in epic CPAP - N/A  Fasting Blood Sugar - N/A Checks Blood Sugar __N/A___ times a day  Blood Thinner Instructions:  N/A Aspirin Instructions:  N/A Last Dose: N/A  Patient instructed to stop taking Phentermine prior to surgery.  Anesthesia review: N/A  Patient denies shortness of breath, fever, cough and chest pain at PAT appointment   Patient verbalized understanding of instructions that were given to them at the PAT appointment. Patient was also instructed that they will need to review over the PAT instructions again at home before surgery.

## 2018-12-20 NOTE — Patient Instructions (Signed)
DUE TO COVID-19 ONLY ONE VISITOR IS ALLOWED TO COME WITH YOU AND STAY IN THE WAITING ROOM ONLY DURING PRE OP AND PROCEDURE. THE ONE VISITOR MAY VISIT WITH YOU IN YOUR PRIVATE ROOM DURING VISITING HOURS ONLY!!   COVID SWAB TESTING MUST BE COMPLETED ON: Today, immediately after pre op appointment    [address], [facility] pre surgical testing line (Must self quarantine after testing. Follow instructions on handout.)             Your procedure is scheduled on: Tuesday, Oct. 13, 2020   Report to [facility] Main  Entrance   Report to Short Stay at 5:30 AM   Call this number if you have problems the morning of surgery [phone]   Do not eat food or drink liquids :After Midnight.   Brush your teeth the morning of surgery.   Do NOT smoke after Midnight   Take these medicines the morning of surgery with A SIP OF WATER: Sertraline   Stop taking Phentermine until after surgery                               You may not have any metal on your body including hair pins, jewelry, and body piercings             Do not wear make-up, lotions, powders, perfumes/cologne, or deodorant             Do not wear nail polish.  Do not shave  48 hours prior to surgery.               Do not bring valuables to the hospital. [place].   Contacts, dentures or bridgework may not be worn into surgery.   Bring small overnight bag day of surgery.     Special Instructions: Bring a copy of your healthcare power of attorney and living will documents         the day of surgery if you haven't scanned them in before.              Please read over the following fact sheets you were given:  [facility] - Preparing for Surgery Before surgery, you can play an important role.  Because skin is not sterile, your skin needs to be as free of germs as possible.  You can reduce the number of germs on your skin by washing  with CHG (chlorahexidine gluconate) soap before surgery.  CHG is an antiseptic cleaner which kills germs and bonds with the skin to continue killing germs even after washing. Please DO NOT use if you have an allergy to CHG or antibacterial soaps.  If your skin becomes reddened/irritated stop using the CHG and inform your nurse when you arrive at Short Stay. Do not shave (including legs and underarms) for at least 48 hours prior to the first CHG shower.  You may shave your face/neck.  Please follow these instructions carefully:  1.  Shower with CHG Soap the night before surgery and the  morning of surgery.  2.  If you choose to wash your hair, wash your hair first as usual with your normal  shampoo.  3.  After you shampoo, rinse your hair and body thoroughly to remove the shampoo.  4.  Use CHG as you would any other liquid soap.  You can apply chg directly to the skin and wash.  Gently with a scrungie or clean washcloth.  5.  Apply the CHG Soap to your body ONLY FROM THE NECK DOWN.   Do   not use on face/ open                           Wound or open sores. Avoid contact with eyes, ears mouth and   genitals (private parts).                       Wash face,  Genitals (private parts) with your normal soap.             6.  Wash thoroughly, paying special attention to the area where your    surgery  will be performed.  7.  Thoroughly rinse your body with warm water from the neck down.  8.  DO NOT shower/wash with your normal soap after using and rinsing off the CHG Soap.                9.  Pat yourself dry with a clean towel.            10.  Wear clean pajamas.            11.  Place clean sheets on your bed the night of your first shower and do not  sleep with pets. Day of Surgery : Do not apply any lotions/deodorants the morning of surgery.  Please wear clean clothes to the hospital/surgery center.  FAILURE TO FOLLOW THESE INSTRUCTIONS MAY RESULT IN THE CANCELLATION OF YOUR  SURGERY  PATIENT SIGNATURE_________________________________  NURSE SIGNATURE__________________________________  ________________________________________________________________________

## 2018-12-21 LAB — NOVEL CORONAVIRUS, NAA (HOSP ORDER, SEND-OUT TO REF LAB; TAT 18-24 HRS): SARS-CoV-2, NAA: NOT DETECTED

## 2018-12-22 ENCOUNTER — Encounter (HOSPITAL_COMMUNITY): Payer: Self-pay | Admitting: Surgery

## 2018-12-23 MED ORDER — DEXTROSE 5 % IV SOLN
3.0000 g | INTRAVENOUS | Status: AC
  Administered 2018-12-24: 3 g via INTRAVENOUS
  Filled 2018-12-23: qty 3

## 2018-12-23 NOTE — Anesthesia Preprocedure Evaluation (Addendum)
Anesthesia Evaluation  Patient identified by MRN, date of birth, ID band Patient awake    Reviewed: Allergy & Precautions, H&P , NPO status , Patient's Chart, lab work & pertinent test results  History of Anesthesia Complications (+) PONV and history of anesthetic complications  Airway Mallampati: II       Dental no notable dental hx. (+) Teeth Intact   Pulmonary    Pulmonary exam normal breath sounds clear to auscultation       Cardiovascular negative cardio ROS Normal cardiovascular exam Rhythm:Regular Rate:Normal     Neuro/Psych  Headaches, PSYCHIATRIC DISORDERS Anxiety Depression    GI/Hepatic negative GI ROS, Neg liver ROS,   Endo/Other  Morbid obesity  Renal/GU negative Renal ROS  negative genitourinary   Musculoskeletal negative musculoskeletal ROS (+)   Abdominal (+) + obese,   Peds  Hematology   Anesthesia Other Findings   Reproductive/Obstetrics                            Anesthesia Physical Anesthesia Plan  ASA: III  Anesthesia Plan: General   Post-op Pain Management:    Induction: Intravenous  PONV Risk Score and Plan: 4 or greater and Ondansetron, Dexamethasone, Midazolam and Scopolamine patch - Pre-op  Airway Management Planned: Oral ETT  Additional Equipment: None  Intra-op Plan:   Post-operative Plan: Extubation in OR  Informed Consent: I have reviewed the patients History and Physical, chart, labs and discussed the procedure including the risks, benefits and alternatives for the proposed anesthesia with the patient or authorized representative who has indicated his/her understanding and acceptance.     Dental advisory given  Plan Discussed with: CRNA  Anesthesia Plan Comments:        Anesthesia Quick Evaluation

## 2018-12-24 ENCOUNTER — Ambulatory Visit (HOSPITAL_COMMUNITY): Payer: BC Managed Care – PPO | Admitting: Physician Assistant

## 2018-12-24 ENCOUNTER — Encounter (HOSPITAL_COMMUNITY)
Admission: RE | Disposition: A | Discharge status: Home / Self Care | Payer: Self-pay | Source: Home / Self Care | Attending: Surgery

## 2018-12-24 ENCOUNTER — Observation Stay (HOSPITAL_COMMUNITY)
Admission: RE | Admit: 2018-12-24 | Discharge: 2018-12-25 | Disposition: A | Discharge status: Home / Self Care | Payer: BC Managed Care – PPO | Attending: Surgery | Admitting: Surgery

## 2018-12-24 ENCOUNTER — Ambulatory Visit (HOSPITAL_COMMUNITY): Payer: BC Managed Care – PPO | Admitting: Anesthesiology

## 2018-12-24 ENCOUNTER — Encounter (HOSPITAL_COMMUNITY): Payer: Self-pay | Admitting: Anesthesiology

## 2018-12-24 ENCOUNTER — Other Ambulatory Visit: Payer: Self-pay

## 2018-12-24 DIAGNOSIS — C73 Malignant neoplasm of thyroid gland: Secondary | ICD-10-CM | POA: Diagnosis not present

## 2018-12-24 DIAGNOSIS — C770 Secondary and unspecified malignant neoplasm of lymph nodes of head, face and neck: Secondary | ICD-10-CM | POA: Diagnosis not present

## 2018-12-24 DIAGNOSIS — Z79899 Other long term (current) drug therapy: Secondary | ICD-10-CM | POA: Insufficient documentation

## 2018-12-24 DIAGNOSIS — F329 Major depressive disorder, single episode, unspecified: Secondary | ICD-10-CM | POA: Diagnosis not present

## 2018-12-24 DIAGNOSIS — F419 Anxiety disorder, unspecified: Secondary | ICD-10-CM | POA: Insufficient documentation

## 2018-12-24 DIAGNOSIS — G43709 Chronic migraine without aura, not intractable, without status migrainosus: Secondary | ICD-10-CM | POA: Diagnosis not present

## 2018-12-24 DIAGNOSIS — E042 Nontoxic multinodular goiter: Secondary | ICD-10-CM | POA: Diagnosis present

## 2018-12-24 DIAGNOSIS — F418 Other specified anxiety disorders: Secondary | ICD-10-CM | POA: Diagnosis not present

## 2018-12-24 DIAGNOSIS — Z6839 Body mass index (BMI) 39.0-39.9, adult: Secondary | ICD-10-CM | POA: Insufficient documentation

## 2018-12-24 DIAGNOSIS — F4310 Post-traumatic stress disorder, unspecified: Secondary | ICD-10-CM | POA: Diagnosis not present

## 2018-12-24 DIAGNOSIS — C779 Secondary and unspecified malignant neoplasm of lymph node, unspecified: Secondary | ICD-10-CM | POA: Diagnosis not present

## 2018-12-24 HISTORY — PX: THYROIDECTOMY: SHX17

## 2018-12-24 SURGERY — THYROIDECTOMY
Anesthesia: General

## 2018-12-24 MED ORDER — ONDANSETRON HCL 4 MG/2ML IJ SOLN: 4.0000 mg | Freq: Four times a day (QID) | INTRAMUSCULAR | Status: DC | PRN

## 2018-12-24 MED ORDER — TRAMADOL HCL 50 MG PO TABS: 50.0000 mg | ORAL_TABLET | Freq: Four times a day (QID) | ORAL | Status: DC | PRN

## 2018-12-24 MED ORDER — ACETAMINOPHEN 650 MG RE SUPP: 650.0000 mg | Freq: Four times a day (QID) | RECTAL | Status: DC | PRN

## 2018-12-24 MED ORDER — HYDROMORPHONE HCL 1 MG/ML IJ SOLN: 1.0000 mg | INTRAMUSCULAR | Status: DC | PRN

## 2018-12-24 MED ORDER — LACTATED RINGERS IV SOLN
INTRAVENOUS | Status: DC
  Administered 2018-12-24: 07:00:00 via INTRAVENOUS

## 2018-12-24 MED ORDER — 0.9 % SODIUM CHLORIDE (POUR BTL) OPTIME
TOPICAL | Status: DC | PRN
  Administered 2018-12-24: 1000 mL

## 2018-12-24 MED ORDER — ACETAMINOPHEN 325 MG PO TABS: 650.0000 mg | ORAL_TABLET | Freq: Four times a day (QID) | ORAL | Status: DC | PRN

## 2018-12-24 MED ORDER — KETOROLAC TROMETHAMINE 30 MG/ML IJ SOLN: 30.0000 mg | Freq: Once | INTRAMUSCULAR | Status: DC | PRN

## 2018-12-24 MED ORDER — HYDROMORPHONE HCL 1 MG/ML IJ SOLN
INTRAMUSCULAR | Status: AC
  Filled 2018-12-24: qty 1

## 2018-12-24 MED ORDER — MIDAZOLAM HCL 5 MG/5ML IJ SOLN
INTRAMUSCULAR | Status: DC | PRN
  Administered 2018-12-24: 2 mg via INTRAVENOUS

## 2018-12-24 MED ORDER — SUCCINYLCHOLINE CHLORIDE 200 MG/10ML IV SOSY
PREFILLED_SYRINGE | INTRAVENOUS | Status: DC | PRN
  Administered 2018-12-24: 160 mg via INTRAVENOUS

## 2018-12-24 MED ORDER — HYDROMORPHONE HCL 1 MG/ML IJ SOLN
0.2500 mg | INTRAMUSCULAR | Status: DC | PRN
  Administered 2018-12-24 (×2): 0.5 mg via INTRAVENOUS

## 2018-12-24 MED ORDER — HYDROCODONE-ACETAMINOPHEN 5-325 MG PO TABS
1.0000 | ORAL_TABLET | ORAL | Status: DC | PRN
  Administered 2018-12-24 (×2): 1 via ORAL
  Administered 2018-12-24: 2 via ORAL
  Administered 2018-12-25: 1 via ORAL
  Filled 2018-12-24: qty 2
  Filled 2018-12-24: qty 1
  Filled 2018-12-24 (×2): qty 2

## 2018-12-24 MED ORDER — PROMETHAZINE HCL 25 MG/ML IJ SOLN: 6.2500 mg | INTRAMUSCULAR | Status: DC | PRN

## 2018-12-24 MED ORDER — CHLORHEXIDINE GLUCONATE CLOTH 2 % EX PADS: 6.0000 | MEDICATED_PAD | Freq: Once | CUTANEOUS | Status: DC

## 2018-12-24 MED ORDER — METHYLPHENIDATE HCL ER (OSM) 27 MG PO TBCR
54.0000 mg | EXTENDED_RELEASE_TABLET | Freq: Every morning | ORAL | Status: DC
  Administered 2018-12-25: 54 mg via ORAL
  Filled 2018-12-24: qty 2

## 2018-12-24 MED ORDER — PROPOFOL 10 MG/ML IV BOLUS
INTRAVENOUS | Status: DC | PRN
  Administered 2018-12-24: 200 mg via INTRAVENOUS

## 2018-12-24 MED ORDER — TRAMADOL HCL 50 MG PO TABS
50.0000 mg | ORAL_TABLET | Freq: Four times a day (QID) | ORAL | Status: DC | PRN
  Administered 2018-12-24: 50 mg via ORAL
  Filled 2018-12-24: qty 1

## 2018-12-24 MED ORDER — KCL IN DEXTROSE-NACL 20-5-0.45 MEQ/L-%-% IV SOLN
INTRAVENOUS | Status: DC
  Administered 2018-12-24 – 2018-12-25 (×2): via INTRAVENOUS
  Filled 2018-12-24 (×2): qty 1000

## 2018-12-24 MED ORDER — CALCIUM CARBONATE 1250 (500 CA) MG PO TABS
2.0000 | ORAL_TABLET | Freq: Three times a day (TID) | ORAL | Status: DC
  Administered 2018-12-24 – 2018-12-25 (×3): 1000 mg via ORAL
  Filled 2018-12-24 (×3): qty 1

## 2018-12-24 MED ORDER — SCOPOLAMINE 1 MG/3DAYS TD PT72
1.0000 | MEDICATED_PATCH | Freq: Once | TRANSDERMAL | Status: AC
  Administered 2018-12-24: 1 via TRANSDERMAL

## 2018-12-24 MED ORDER — MEPERIDINE HCL 50 MG/ML IJ SOLN: 6.2500 mg | INTRAMUSCULAR | Status: DC | PRN

## 2018-12-24 MED ORDER — FENTANYL CITRATE (PF) 100 MCG/2ML IJ SOLN
INTRAMUSCULAR | Status: DC | PRN
  Administered 2018-12-24 (×3): 50 ug via INTRAVENOUS
  Administered 2018-12-24: 100 ug via INTRAVENOUS

## 2018-12-24 MED ORDER — ONDANSETRON HCL 4 MG/2ML IJ SOLN
INTRAMUSCULAR | Status: DC | PRN
  Administered 2018-12-24 (×2): 4 mg via INTRAVENOUS

## 2018-12-24 MED ORDER — ROCURONIUM BROMIDE 10 MG/ML (PF) SYRINGE
PREFILLED_SYRINGE | INTRAVENOUS | Status: DC | PRN
  Administered 2018-12-24: 50 mg via INTRAVENOUS

## 2018-12-24 MED ORDER — DEXAMETHASONE SODIUM PHOSPHATE 10 MG/ML IJ SOLN
INTRAMUSCULAR | Status: DC | PRN
  Administered 2018-12-24: 10 mg via INTRAVENOUS

## 2018-12-24 MED ORDER — ONDANSETRON 4 MG PO TBDP: 4.0000 mg | ORAL_TABLET | Freq: Four times a day (QID) | ORAL | Status: DC | PRN

## 2018-12-24 MED ORDER — SUGAMMADEX SODIUM 500 MG/5ML IV SOLN
INTRAVENOUS | Status: DC | PRN
  Administered 2018-12-24: 300 mg via INTRAVENOUS

## 2018-12-24 MED ORDER — GLYCOPYRROLATE PF 0.2 MG/ML IJ SOSY
PREFILLED_SYRINGE | INTRAMUSCULAR | Status: DC | PRN
  Administered 2018-12-24: .2 mg via INTRAVENOUS

## 2018-12-24 MED ORDER — LIDOCAINE 2% (20 MG/ML) 5 ML SYRINGE
INTRAMUSCULAR | Status: DC | PRN
  Administered 2018-12-24: 70 mg via INTRAVENOUS
  Administered 2018-12-24: 30 mg via INTRAVENOUS

## 2018-12-24 MED ORDER — SERTRALINE HCL 50 MG PO TABS
250.0000 mg | ORAL_TABLET | Freq: Every day | ORAL | Status: DC
  Administered 2018-12-24 – 2018-12-25 (×2): 250 mg via ORAL
  Filled 2018-12-24 (×2): qty 5

## 2018-12-24 SURGICAL SUPPLY — 32 items
ADH SKN CLS APL DERMABOND .7 (GAUZE/BANDAGES/DRESSINGS) ×1
APL PRP STRL LF DISP 70% ISPRP (MISCELLANEOUS) ×1
ATTRACTOMAT 16X20 MAGNETIC DRP (DRAPES) ×2 IMPLANT
BLADE SURG 15 STRL LF DISP TIS (BLADE) ×1 IMPLANT
BLADE SURG 15 STRL SS (BLADE) ×2
CHLORAPREP W/TINT 26 (MISCELLANEOUS) ×3 IMPLANT
CLIP VESOCCLUDE MED 6/CT (CLIP) ×4 IMPLANT
CLIP VESOCCLUDE SM WIDE 6/CT (CLIP) ×7 IMPLANT
COVER SURGICAL LIGHT HANDLE (MISCELLANEOUS) ×2 IMPLANT
COVER WAND RF STERILE (DRAPES) ×2 IMPLANT
DERMABOND ADVANCED (GAUZE/BANDAGES/DRESSINGS) ×1
DERMABOND ADVANCED .7 DNX12 (GAUZE/BANDAGES/DRESSINGS) IMPLANT
DRAPE LAPAROTOMY T 98X78 PEDS (DRAPES) ×2 IMPLANT
ELECT PENCIL ROCKER SW 15FT (MISCELLANEOUS) ×2 IMPLANT
ELECT REM PT RETURN 15FT ADLT (MISCELLANEOUS) ×2 IMPLANT
GAUZE 4X4 16PLY RFD (DISPOSABLE) ×2 IMPLANT
GLOVE SURG ORTHO 8.0 STRL STRW (GLOVE) ×2 IMPLANT
GOWN STRL REUS W/TWL XL LVL3 (GOWN DISPOSABLE) ×4 IMPLANT
HEMOSTAT SURGICEL 2X4 FIBR (HEMOSTASIS) ×1 IMPLANT
ILLUMINATOR WAVEGUIDE N/F (MISCELLANEOUS) IMPLANT
KIT BASIN OR (CUSTOM PROCEDURE TRAY) ×2 IMPLANT
KIT TURNOVER KIT A (KITS) IMPLANT
NERVE STIMULATOR WAVEFORM 16S (NEUROSURGERY SUPPLIES) ×2
PACK BASIC VI WITH GOWN DISP (CUSTOM PROCEDURE TRAY) ×2 IMPLANT
SHEARS HARMONIC 9CM CVD (BLADE) ×2 IMPLANT
STIMULATOR NERVE WAVEFORM 16S (NEUROSURGERY SUPPLIES) IMPLANT
SUT MNCRL AB 4-0 PS2 18 (SUTURE) ×2 IMPLANT
SUT VIC AB 3-0 SH 18 (SUTURE) ×4 IMPLANT
SYR BULB IRRIGATION 50ML (SYRINGE) ×2 IMPLANT
TOWEL OR 17X26 10 PK STRL BLUE (TOWEL DISPOSABLE) ×2 IMPLANT
TOWEL OR NON WOVEN STRL DISP B (DISPOSABLE) ×2 IMPLANT
TUBING CONNECTING 10 (TUBING) ×2 IMPLANT

## 2018-12-24 NOTE — Transfer of Care (Signed)
Immediate Anesthesia Transfer of Care Note  Patient: Tara Ferguson  Procedure(s) Performed: Procedure(s): TOTAL THYROIDECTOMY (N/A)  Patient Location: PACU  Anesthesia Type:General  Level of Consciousness:  sedated, patient cooperative and responds to stimulation  Airway & Oxygen Therapy:Patient Spontanous Breathing and Patient connected to face mask oxgen  Post-op Assessment:  Report given to PACU RN and Post -op Vital signs reviewed and stable  Post vital signs:  Reviewed and stable  Last Vitals:  Vitals:   12/24/18 0555 12/24/18 0956  BP: 134/83 (!) 149/87  Pulse: 60 67  Resp: 18 16  Temp: 36.9 C 36.7 C  SpO2: 123XX123 [id number]    Complications: No apparent anesthesia complications

## 2018-12-24 NOTE — Anesthesia Procedure Notes (Signed)
Procedure Name: Intubation Date/Time: 12/24/2018 7:40 AM Performed by: Lavina Hamman, CRNA Pre-anesthesia Checklist: Patient identified, Emergency Drugs available, Suction available, Patient being monitored and Timeout performed Patient Re-evaluated:Patient Re-evaluated prior to induction Oxygen Delivery Method: Circle system utilized Preoxygenation: Pre-oxygenation with 100% oxygen Induction Type: IV induction Ventilation: Mask ventilation without difficulty Laryngoscope Size: Mac and 4 Grade View: Grade II Tube type: Oral Tube size: 7.5 mm Number of attempts: 1 Airway Equipment and Method: Stylet Placement Confirmation: ETT inserted through vocal cords under direct vision,  positive ETCO2,  CO2 detector and breath sounds checked- equal and bilateral Secured at: 22 cm Tube secured with: Tape Dental Injury: Teeth and Oropharynx as per pre-operative assessment  Comments: ATOI, braces intact.

## 2018-12-24 NOTE — Interval H&P Note (Signed)
History and Physical Interval Note:  12/24/2018 6:58 AM  Tara Ferguson  has presented today for surgery, with the diagnosis of PAPILLARY THYROID.  The various methods of treatment have been discussed with the patient and family. After consideration of risks, benefits and other options for treatment, the patient has consented to    Procedure(s): TOTAL THYROIDECTOMY (N/A) as a surgical intervention.    The patient's history has been reviewed, patient examined, no change in status, stable for surgery.  I have reviewed the patient's chart and labs.  Questions were answered to the patient's satisfaction.    Armandina Gemma, [facility] Office: [place]

## 2018-12-24 NOTE — Op Note (Addendum)
Procedure Note  Pre-operative Diagnosis:  Papillary thyroid carcinoma  Post-operative Diagnosis:  same  Surgeon:  Armandina Gemma, MD  Assistant:  none   Procedure:  Total thyroidectomy with limited lymph node dissection, intra-op recurrent laryngeal nerve monitoring / stimulation  Anesthesia:  General  Estimated Blood Loss:  minimal  Drains: none         Specimen: thyroid to pathology  Indications:  Patient is referred by Dr. Kathyrn Lass for surgical evaluation and management of papillary thyroid carcinoma. Patient has a known history of thyroid nodules dating back approximately 7 years. She was initially evaluated with ultrasound. She did not have any sequential follow-up. Recently the patient has had some issues with anxiety. As part of her evaluation, thyroid function test were obtained. These showed a normal TSH level of 1.73. Patient then underwent an ultrasound examination of the thyroid on October 14, 2018. This showed an enlarged thyroid gland with the right lobe measuring 6.8 cm and left lobe measuring 6.5 cm. There were bilateral thyroid nodules. Dominant nodule on the right was biopsied and found to be benign. Nodule on the left measuring 1.3 cm and located inferiorly had microcalcifications and was felt to be highly suspicious. Fine-needle aspiration biopsy was positive for papillary thyroid carcinoma. Patient is now referred for consideration for thyroidectomy. Patient has had no prior thyroid surgery. She has had no prior head or neck surgery. She has never been on thyroid medication. There is no family history of thyroid disease and no family history of thyroid cancer. Patient has noted mild solid food dysphagia. Otherwise she has had no compressive symptoms. She denies tremors or palpitations. She presents today for thyroidectomy for management of papillary thyroid carcinoma. She is followed by Dr. Vivia Ewing for endocrinology.  Procedure Details: Procedure  was done in OR #4 at the [facility]. The patient was brought to the operating room and placed in a supine position on the operating room table. Following administration of general anesthesia, the patient was positioned and then prepped and draped in the usual aseptic fashion. After ascertaining that an adequate level of anesthesia had been achieved, a small Kocher incision was made with #15 blade. Dissection was carried through subcutaneous tissues and platysma.Hemostasis was achieved with the electrocautery. Skin flaps were elevated cephalad and caudad from the thyroid notch to the sternal notch. A Mahorner self-retaining retractor was placed for exposure. Strap muscles were incised in the midline and dissection was begun on the left side.  Strap muscles were reflected laterally.  Left thyroid lobe was mildly enlarged and nodular.  There is a firm nodule located medially at the junction of the left lobe and isthmus.  The left lobe was gently mobilized with blunt dissection. Superior pole vessels were dissected out and divided individually between small and medium ligaclips with the harmonic scalpel. The thyroid lobe was rolled anteriorly. Branches of the inferior thyroid artery were divided between small ligaclips with the harmonic scalpel. Inferior venous tributaries were divided between ligaclips. Both the superior and inferior parathyroid glands were identified and preserved on their vascular pedicles. The recurrent laryngeal nerve was identified and preserved along its course. The ligament of Gwenlyn Found was released with the electrocautery and the gland was mobilized onto the anterior trachea. Isthmus was mobilized across the midline. There was no significant pyramidal lobe present.  The left recurrent laryngeal nerve was interrogated with the CheckPoint stimulating device which demonstrated continuity of the nerve throughout its course in the left neck.  Dry pack was  placed in the left neck.  The right  thyroid lobe was gently mobilized with blunt dissection. Right thyroid lobe was moderately enlarged and nodular. Superior pole vessels were dissected out and divided between small and medium ligaclips with the Harmonic scalpel. Superior parathyroid was identified and preserved. Inferior venous tributaries were divided between medium ligaclips with the harmonic scalpel. The right thyroid lobe was rolled anteriorly and the branches of the inferior thyroid artery divided between small ligaclips. The right recurrent laryngeal nerve was identified and preserved along its course. The ligament of Gwenlyn Found was released with the electrocautery. The right thyroid lobe was mobilized onto the anterior trachea and the remainder of the thyroid was dissected off the anterior trachea and the thyroid was completely excised. A suture was used to mark the right lobe. The entire thyroid gland was submitted to pathology for review.  Again the right recurrent laryngeal nerve was interrogated with the CheckPoint stimulating device and again continuity of the nerve was demonstrated throughout its course in the right neck.  Next the central compartment contents were dissected out using the electrocautery and small ligaclips for hemostasis.  Tissue anterior to the trachea extending from the right carotid to the left carotid was mobilized.  To the left of midline were at least 2 enlarged quite firm lymph nodes which likely contain tumor.  These were resected en bloc with the central compartment tissue and submitted to pathology for review.  A final check of the recurrent laryngeal nerves bilaterally was made with the CheckPoint stimulating device and nerve function was intact bilaterally.  The neck was irrigated with warm saline. Fibrillar was placed throughout the operative field. Strap muscles were approximated in the midline with interrupted 3-0 Vicryl sutures. Platysma was closed with interrupted 3-0 Vicryl sutures. Skin was closed  with a running 4-0 Monocryl subcuticular suture. Wound was washed and Dermabond was applied. The patient was awakened from anesthesia and brought to the recovery room. The patient tolerated the procedure well.   Armandina Gemma, MD [facility] Office: [phone]

## 2018-12-24 NOTE — Anesthesia Postprocedure Evaluation (Signed)
Anesthesia Post Note  Patient: Tara Ferguson  Procedure(s) Performed: TOTAL THYROIDECTOMY (N/A )     Patient location during evaluation: PACU Anesthesia Type: General Level of consciousness: awake Pain management: pain level controlled Vital Signs Assessment: post-procedure vital signs reviewed and stable Respiratory status: spontaneous breathing Cardiovascular status: stable Postop Assessment: no apparent nausea or vomiting Anesthetic complications: no    Last Vitals:  Vitals:   12/24/18 1030 12/24/18 1045  BP: (!) 135/94 132/83  Pulse: 61 (!) 59  Resp: (!) 21 13  Temp:    SpO2: 99% 98%    Last Pain:  Vitals:   12/24/18 1045  TempSrc:   PainSc: 5    Pain Goal:                   Huston Foley

## 2018-12-25 ENCOUNTER — Encounter (HOSPITAL_COMMUNITY): Payer: Self-pay | Admitting: Surgery

## 2018-12-25 DIAGNOSIS — C73 Malignant neoplasm of thyroid gland: Secondary | ICD-10-CM | POA: Diagnosis not present

## 2018-12-25 DIAGNOSIS — Z6839 Body mass index (BMI) 39.0-39.9, adult: Secondary | ICD-10-CM | POA: Diagnosis not present

## 2018-12-25 DIAGNOSIS — F419 Anxiety disorder, unspecified: Secondary | ICD-10-CM | POA: Diagnosis not present

## 2018-12-25 DIAGNOSIS — Z79899 Other long term (current) drug therapy: Secondary | ICD-10-CM | POA: Diagnosis not present

## 2018-12-25 DIAGNOSIS — C770 Secondary and unspecified malignant neoplasm of lymph nodes of head, face and neck: Secondary | ICD-10-CM | POA: Diagnosis not present

## 2018-12-25 DIAGNOSIS — F329 Major depressive disorder, single episode, unspecified: Secondary | ICD-10-CM | POA: Diagnosis not present

## 2018-12-25 LAB — BASIC METABOLIC PANEL
Anion gap: 10 (ref 5–15)
BUN: 10 mg/dL (ref 6–20)
CO2: 24 mmol/L (ref 22–32)
Calcium: 8 mg/dL — ABNORMAL LOW (ref 8.9–10.3)
Chloride: 106 mmol/L (ref 98–111)
Creatinine, Ser: 0.63 mg/dL (ref 0.44–1.00)
GFR calc Af Amer: >60 mL/min (ref >60)
GFR calc non Af Amer: >60 mL/min (ref >60)
Glucose, Bld: 102 mg/dL — ABNORMAL HIGH (ref 70–99)
Potassium: 3.7 mmol/L (ref 3.5–5.1)
Sodium: 140 mmol/L (ref 135–145)

## 2018-12-25 LAB — SURGICAL PATHOLOGY

## 2018-12-25 MED ORDER — LEVOTHYROXINE SODIUM 100 MCG PO TABS: 100.0000 ug | ORAL_TABLET | Freq: Every day | ORAL | 3 refills | Status: DC

## 2018-12-25 MED ORDER — CALCIUM GLUCONATE-NACL 2-0.675 GM/100ML-% IV SOLN
2.0000 g | INTRAVENOUS | Status: AC
  Administered 2018-12-25: 2000 mg via INTRAVENOUS
  Filled 2018-12-25: qty 100

## 2018-12-25 MED ORDER — HYDROCODONE-ACETAMINOPHEN 5-325 MG PO TABS: 1.0000 | ORAL_TABLET | ORAL | 0 refills | Status: DC | PRN

## 2018-12-25 MED ORDER — CALCIUM CARBONATE ANTACID 500 MG PO CHEW: 2.0000 | CHEWABLE_TABLET | Freq: Four times a day (QID) | ORAL | 1 refills | Status: DC

## 2018-12-25 NOTE — Discharge Summary (Signed)
Physician Discharge Summary [facility]  Patient ID: Tara Ferguson MRN: [id number] DOB/AGE: 1981-02-24 38 y.o.  Admit date: 12/24/2018 Discharge date: 12/25/2018  Admission Diagnoses:  Papillary thyroid carcinoma  Discharge Diagnoses:  Principal Problem:   Papillary carcinoma of thyroid ([place]) Active Problems:   Multinodular goiter   Papillary thyroid carcinoma [facility])   Discharged Condition: good  Hospital Course: Patient was admitted for observation following thyroid surgery.  Post op course was uncomplicated.  Pain was well controlled.  Tolerated diet.  Post op calcium level on morning following surgery was 8.0 mg/dl.  IV calcium gluconate was administered prior to discharge home.  Patient was prepared for discharge home on POD#1.  Consults: None  Treatments: surgery: total thyroidectomy with limited lymph node dissection, nerve monitoring  Discharge Exam: Blood pressure 123/74, pulse (!) 56, temperature 98 F (36.7 C), temperature source Oral, resp. rate 16, height 5\' 10"  (1.778 m), weight 123.7 kg, SpO2 100 %. HEENT - clear Neck - wound dry and intact, Dermabond in place; moderate ecchymosis with moderate soft tissue swelling upper skin flap; voice normal Chest - clear bilaterally Cor - RRR   Disposition: Home  Discharge Instructions    Diet - low sodium heart healthy   Complete by: As directed    Discharge instructions   Complete by: As directed    [facility]  THYROID & PARATHYROID SURGERY:  POST-OP INSTRUCTIONS  Always review your discharge instruction sheet from the facility where your surgery was performed.  A prescription for pain medication may be given to you upon discharge.  Take your pain medication as prescribed.  If narcotic pain medicine is not needed, then you may take acetaminophen (Tylenol) or ibuprofen (Advil) as needed.  Take your usually prescribed medications unless otherwise directed.  If you need  a refill on your pain medication, please contact our office during regular business hours.  Prescriptions cannot be processed by our office after 5 pm or on weekends.  Start with a light diet upon arrival home, such as soup and crackers or toast.  Be sure to drink plenty of fluids daily.  Resume your normal diet the day after surgery.  Most patients will experience some swelling and bruising on the chest and neck area.  Ice packs will help.  Swelling and bruising can take several days to resolve.   It is common to experience some constipation after surgery.  Increasing fluid intake and taking a stool softener (Colace) will usually help or prevent this problem.  A mild laxative (Milk of Magnesia or Miralax) should be taken according to package directions if there has been no bowel movement after 48 hours.  You have steri-strips and a gauze dressing over your incision.  You may remove the gauze bandage on the second day after surgery, and you may shower at that time.  Leave your steri-strips (small skin tapes) in place directly over the incision.  These strips should remain on the skin for 5-7 days and then be removed.  You may get them wet in the shower and pat them dry.  You may resume regular (light) daily activities beginning the next day (such as daily self-care, walking, climbing stairs) gradually increasing activities as tolerated.  You may have sexual intercourse when it is comfortable.  Refrain from any heavy lifting or straining until approved by your doctor.  You may drive when you no longer are taking prescription pain medication, you can comfortably wear a seatbelt, and you  can safely maneuver your car and apply brakes.  You should see your doctor in the office for a follow-up appointment approximately three weeks after your surgery.  Make sure that you call for this appointment within a day or two after you arrive home to insure a convenient appointment time.  WHEN TO CALL YOUR DOCTOR: --  Fever greater than 101.5 -- Inability to urinate -- Nausea and/or vomiting - persistent -- Extreme swelling or bruising -- Continued bleeding from incision -- Increased pain, redness, or drainage from the incision -- Difficulty swallowing or breathing -- Muscle cramping or spasms -- Numbness or tingling in hands or around lips  The clinic staff is available to answer your questions during regular business hours.  Please don't hesitate to call and ask to speak to one of the nurses if you have concerns.  Armandina Gemma, MD [facility] Office: [phone]   Increase activity slowly   Complete by: As directed    No dressing needed   Complete by: As directed      Allergies as of 12/25/2018      Reactions   Tape Other (See Comments)   Clear surgical tape causes blisters   Wellbutrin [bupropion]    Highly suspicious for causing headaches per pt       Medication List    TAKE these medications   calcium carbonate 500 MG chewable tablet Commonly known as: Tums Chew 2 tablets (400 mg of elemental calcium total) by mouth 4 (four) times daily.   HYDROcodone-acetaminophen 5-325 MG tablet Commonly known as: NORCO/VICODIN Take 1-2 tablets by mouth every 4 (four) hours as needed for moderate pain.   HYDROcodone-acetaminophen 5-325 MG tablet Commonly known as: NORCO/VICODIN Take 1-2 tablets by mouth every 4 (four) hours as needed for moderate pain.   ibuprofen 200 MG tablet Commonly known as: ADVIL Take 800 mg by mouth every 8 (eight) hours as needed. Pain   levothyroxine 100 MCG tablet Commonly known as: Synthroid Take 1 tablet (100 mcg total) by mouth daily.   methylphenidate 54 MG CR tablet Commonly known as: Concerta Take 1 tablet (54 mg total) by mouth every morning. What changed: when to take this   phentermine 37.5 MG tablet Commonly known as: ADIPEX-P Take 37.5 mg by mouth daily before breakfast.   prochlorperazine 10 MG tablet Commonly known as:  Compazine Take 1 tablet (10 mg total) by mouth every 6 (six) hours as needed for nausea or vomiting.   sertraline 100 MG tablet Commonly known as: Zoloft Take 2 tablets (200 mg total) by mouth daily. What changed: how much to take        Earnstine Regal, MD, [facility] Office: [phone]   Signed: Armandina Gemma 12/25/2018, 8:28 AM

## 2018-12-26 ENCOUNTER — Telehealth: Payer: Self-pay | Admitting: Internal Medicine

## 2018-12-26 NOTE — Telephone Encounter (Signed)
Patient requests to be called at ph# [phone] once her Pathology Report has been received by Dr. Kelton Pillar.

## 2019-01-12 ENCOUNTER — Telehealth: Payer: Self-pay | Admitting: General Surgery

## 2019-01-12 ENCOUNTER — Other Ambulatory Visit: Payer: Self-pay

## 2019-01-12 ENCOUNTER — Emergency Department (HOSPITAL_COMMUNITY)
Admission: EM | Admit: 2019-01-12 | Discharge: 2019-01-12 | Disposition: A | Discharge status: Home / Self Care | Payer: BC Managed Care – PPO | Attending: Emergency Medicine | Admitting: Emergency Medicine

## 2019-01-12 ENCOUNTER — Emergency Department (HOSPITAL_COMMUNITY): Payer: BC Managed Care – PPO

## 2019-01-12 ENCOUNTER — Encounter (HOSPITAL_COMMUNITY): Payer: Self-pay

## 2019-01-12 DIAGNOSIS — R079 Chest pain, unspecified: Secondary | ICD-10-CM

## 2019-01-12 DIAGNOSIS — R0789 Other chest pain: Secondary | ICD-10-CM | POA: Insufficient documentation

## 2019-01-12 DIAGNOSIS — Z79899 Other long term (current) drug therapy: Secondary | ICD-10-CM | POA: Insufficient documentation

## 2019-01-12 LAB — CBC
HCT: 41.4 % (ref 36.0–46.0)
Hemoglobin: 14.1 g/dL (ref 12.0–15.0)
MCH: 31.6 pg (ref 26.0–34.0)
MCHC: 34.1 g/dL (ref 30.0–36.0)
MCV: 92.8 fL (ref 80.0–100.0)
Platelets: 277 10*3/uL (ref 150–400)
RBC: 4.46 MIL/uL (ref 3.87–5.11)
RDW: 13.8 % (ref 11.5–15.5)
WBC: 8.9 10*3/uL (ref 4.0–10.5)
nRBC: 0 % (ref 0.0–0.2)

## 2019-01-12 LAB — BASIC METABOLIC PANEL
Anion gap: 10 (ref 5–15)
BUN: 10 mg/dL (ref 6–20)
CO2: 21 mmol/L — ABNORMAL LOW (ref 22–32)
Calcium: 8.9 mg/dL (ref 8.9–10.3)
Chloride: 105 mmol/L (ref 98–111)
Creatinine, Ser: 0.75 mg/dL (ref 0.44–1.00)
GFR calc Af Amer: >60 mL/min (ref >60)
GFR calc non Af Amer: >60 mL/min (ref >60)
Glucose, Bld: 100 mg/dL — ABNORMAL HIGH (ref 70–99)
Potassium: 3.7 mmol/L (ref 3.5–5.1)
Sodium: 136 mmol/L (ref 135–145)

## 2019-01-12 LAB — I-STAT BETA HCG BLOOD, ED (NOT ORDERABLE): I-stat hCG, quantitative: <5 m[IU]/mL (ref <5)

## 2019-01-12 LAB — TROPONIN I (HIGH SENSITIVITY)
Troponin I (High Sensitivity): <2 ng/L (ref <18)
Troponin I (High Sensitivity): 2 ng/L (ref <18)

## 2019-01-12 MED ORDER — SODIUM CHLORIDE 0.9% FLUSH
3.0000 mL | Freq: Once | INTRAVENOUS | Status: AC
  Administered 2019-01-12: 3 mL via INTRAVENOUS

## 2019-01-12 MED ORDER — IOHEXOL 350 MG/ML SOLN
100.0000 mL | Freq: Once | INTRAVENOUS | Status: AC | PRN
  Administered 2019-01-12: 100 mL via INTRAVENOUS

## 2019-01-12 MED ORDER — SODIUM CHLORIDE (PF) 0.9 % IJ SOLN
INTRAMUSCULAR | Status: AC
  Filled 2019-01-12: qty 50

## 2019-01-12 NOTE — ED Provider Notes (Signed)
[facility] DEPT Provider Note   CSN: [id number] Arrival date & time: 01/12/19  1526     History   Chief Complaint Chief Complaint  Patient presents with  . Chest Pain    HPI Tara Ferguson is a 38 y.o. female.  She has a history of thyroid cancer and had a thyroidectomy about 3 weeks ago.  Since then she has had some pressure in her upper chest radiating into her neck and feeling like her neck is being squeezed.  She assumed it was related to the surgery and improved with but it has not gone away.  She talked to her surgery group today and they said come to the emergency department.  She says she feels a little bit short of breath with it.  She rates the pain as mild to moderate and she has gas Before but she said this is lasted longer.  No fevers or chills.  She says her wound is been doing fine     The history is provided by the patient.  Chest Pain Pain location:  Substernal area Pain quality: pressure   Pain radiates to:  Neck Pain severity:  Moderate Onset quality:  Gradual Timing:  Constant Progression:  Unchanged Chronicity:  New Relieved by:  Nothing Worsened by:  Nothing Ineffective treatments:  None tried Associated symptoms: shortness of breath   Associated symptoms: no abdominal pain, no back pain, no cough, no diaphoresis, no fever, no headache, no nausea and no vomiting   Risk factors: no coronary artery disease     Past Medical History:  Diagnosis Date  . Carpal tunnel syndrome, bilateral   . Depression   . Hyperhidrosis   . Migraines   . Multinodular goiter   . Pneumonia    history of  . PONV (postoperative nausea and vomiting)   . Thyroid cancer ([place])    Papillary Thyroid Carcinoma     Patient Active Problem List   Diagnosis Date Noted  . 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 Multinodular goiter 12/11/2018  . Prolonged posttraumatic stress disorder  01/09/2018  . Chronic migraine without aura without status migrainosus, not intractable 02/07/2016  . Depression 02/07/2016    Past Surgical History:  Procedure Laterality Date  . BACK SURGERY    . SYMPATHECTOMY    . THYROIDECTOMY N/A 12/24/2018   Procedure: TOTAL THYROIDECTOMY;  Surgeon: Armandina Gemma, MD;  Location: [facility];  Service: General;  Laterality: N/A;     OB History   No obstetric history on file.      Home Medications    Prior to Admission medications   Medication Sig Start Date End Date Taking? Authorizing Provider  calcium carbonate (TUMS) 500 MG chewable tablet Chew 2 tablets (400 mg of elemental calcium total) by mouth 4 (four) times daily. 12/25/18   Armandina Gemma, MD  HYDROcodone-acetaminophen (NORCO/VICODIN) 5-325 MG tablet Take 1-2 tablets by mouth every 4 (four) hours as needed for moderate pain. 12/25/18   Armandina Gemma, MD  HYDROcodone-acetaminophen (NORCO/VICODIN) 5-325 MG tablet Take 1-2 tablets by mouth every 4 (four) hours as needed for moderate pain. 12/25/18   Armandina Gemma, MD  ibuprofen (ADVIL,MOTRIN) 200 MG tablet Take 800 mg by mouth every 8 (eight) hours as needed. Pain    [provider]  levothyroxine (SYNTHROID) 100 MCG tablet Take 1 tablet (100 mcg total) by mouth daily. 12/25/18   Armandina Gemma, MD  methylphenidate (CONCERTA) 54 MG PO CR  tablet Take 1 tablet (54 mg total) by mouth every morning. Patient taking differently: Take 54 mg by mouth every morning.  11/08/18 02/06/19  Thayer Headings, PMHNP  phentermine (ADIPEX-P) 37.5 MG tablet Take 37.5 mg by mouth daily before breakfast.  11/11/18   [provider]  prochlorperazine (COMPAZINE) 10 MG tablet Take 1 tablet (10 mg total) by mouth every 6 (six) hours as needed for nausea or vomiting. Patient not taking: Reported on 12/16/2018 08/15/17   Melvenia Beam, MD  sertraline (ZOLOFT) 100 MG tablet Take 2 tablets (200 mg total) by mouth daily. Patient taking differently: Take 250 mg by  mouth daily.  08/09/18 12/16/18  Thayer Headings, PMHNP    Family History Family History  Problem Relation Age of Onset  . Depression Father   . Bipolar disorder Father   . Suicidality Father   . ADD / ADHD Father   . Migraines Brother   . ADD / ADHD Brother   . Depression Maternal Grandmother     Social History Social History   Tobacco Use  . Smoking status: Never Smoker  . Smokeless tobacco: Never Used  Substance Use Topics  . Alcohol use: No  . Drug use: No     Allergies   Tape and Wellbutrin [bupropion]   Review of Systems Review of Systems  Constitutional: Negative for diaphoresis and fever.  HENT: Negative for sore throat.   Eyes: Negative for visual disturbance.  Respiratory: Positive for shortness of breath. Negative for cough.   Cardiovascular: Positive for chest pain.  Gastrointestinal: Negative for abdominal pain, nausea and vomiting.  Genitourinary: Negative for dysuria.  Musculoskeletal: Positive for neck pain. Negative for back pain.  Skin: Negative for rash.  Neurological: Negative for headaches.     Physical Exam Updated Vital Signs BP (!) 146/79 (BP Location: Left Arm)   Pulse 61   Temp 98.8 F (37.1 C) (Oral)   Resp 17   Wt 123 kg   LMP 12/12/2018   SpO2 100%   BMI 38.91 kg/m   Physical Exam Vitals signs and nursing note reviewed.  Constitutional:      General: She is not in acute distress.    Appearance: She is well-developed.  HENT:     Head: Normocephalic and atraumatic.  Eyes:     Conjunctiva/sclera: Conjunctivae normal.  Neck:     Musculoskeletal: Neck supple.     Comments: She is got a well-healed surgical scar in her lower neck.  There is no overlying erythema not feel any crepitus or fluctuance.  Cardiovascular:     Rate and Rhythm: Normal rate and regular rhythm.     Heart sounds: Normal heart sounds. No murmur.  Pulmonary:     Effort: Pulmonary effort is normal. No respiratory distress.     Breath sounds: Normal  breath sounds.  Abdominal:     Palpations: Abdomen is soft.     Tenderness: There is no abdominal tenderness.  Musculoskeletal: Normal range of motion.     Right lower leg: She exhibits no tenderness.     Left lower leg: She exhibits no tenderness.  Skin:    General: Skin is warm and dry.     Capillary Refill: Capillary refill takes less than 2 seconds.  Neurological:     General: No focal deficit present.     Mental Status: She is alert.      ED Treatments / Results  Labs (all labs ordered are listed, but only abnormal results are displayed) Labs  Reviewed  BASIC METABOLIC PANEL - Abnormal; Notable for the following components:      Result Value   CO2 21 (*)    Glucose, Bld 100 (*)    All other components within normal limits  CBC  I-STAT BETA HCG BLOOD, ED ([facility], [facility], [facility] ONLY)  I-STAT BETA HCG BLOOD, ED (NOT ORDERABLE)  TROPONIN I (HIGH SENSITIVITY)  TROPONIN I (HIGH SENSITIVITY)    EKG EKG Interpretation  Date/Time:  Sunday January 12 2019 15:48:55 EST Ventricular Rate:  78 PR Interval:    QRS Duration: 100 QT Interval:  386 QTC Calculation: 440 R Axis:   47 Text Interpretation: Sinus rhythm Anteroseptal infarct, old Baseline wander in lead(s) II aVR aVF No old tracing to compare Confirmed by Aletta Edouard [phone]) on 01/12/2019 3:58:26 PM   Radiology Dg Chest 2 View  Result Date: 01/12/2019 CLINICAL DATA:  Chest pain thyroid removed on October 13th. EXAM: CHEST - 2 VIEW COMPARISON:  Chest x-ray dated 12/20/2018. FINDINGS: Cardiomediastinal silhouette is within normal limits in size and configuration. Lungs are clear. Lung volumes are normal. No evidence of pneumonia. No pleural effusion. No pneumothorax seen. Osseous structures about the chest are unremarkable. IMPRESSION: Normal chest x-ray. Electronically Signed   By: Franki Cabot M.D.   On: 01/12/2019 16:08   Ct Angio Chest Pe W/cm &/or Wo Cm  Result Date: 01/12/2019 CLINICAL DATA:  Chest pain EXAM: CT  ANGIOGRAPHY CHEST WITH CONTRAST TECHNIQUE: Multidetector CT imaging of the chest was performed using the standard protocol during bolus administration of intravenous contrast. Multiplanar CT image reconstructions and MIPs were obtained to evaluate the vascular anatomy. CONTRAST:  174mL OMNIPAQUE IOHEXOL 350 MG/ML SOLN COMPARISON:  Chest x-ray today FINDINGS: Cardiovascular: No filling defects in the pulmonary arteries to suggest pulmonary emboli. Heart is normal size. Aorta is normal caliber. Mediastinum/Nodes: No mediastinal, hilar, or axillary adenopathy. Trachea and esophagus are unremarkable. Lungs/Pleura: 2-3 mm calcified granuloma anteriorly in the left lower lobe. No suspicious pulmonary nodules or confluent opacities. No effusions. Upper Abdomen: Imaging into the upper abdomen shows no acute findings. Musculoskeletal: Chest wall soft tissues are unremarkable. No acute bony abnormality. Review of the MIP images confirms the above findings. IMPRESSION: No evidence of pulmonary embolus. No acute cardiopulmonary disease. Electronically Signed   By: Rolm Baptise M.D.   On: 01/12/2019 22:15    Procedures Procedures (including critical care time)  Medications Ordered in ED Medications  sodium chloride flush (NS) 0.9 % injection 3 mL (has no administration in time range)     Initial Impression / Assessment and Plan / ED Course  I have reviewed the triage vital signs and the nursing notes.  Pertinent labs & imaging results that were available during my care of the patient were reviewed by me and considered in my medical decision making (see chart for details).  Clinical Course as of Jan 13 1052  Sun Nov 01, 823  3544 38 year old female with upper chest pain that is been going on constant for weeks.  Differential includes ACS, postoperative pain, musculoskeletal, GERD, PE.  Her cardiac work-up so far has been unremarkable with a negative troponin chest x-ray EKG.  She is not low risk as far as PE  and we had a discussion and she is comfortable with going with a plan for getting a chest CT.  We will get a delta Trope.  If both of these are negative I think she can be safely discharged to follow-up with her doctor for further evaluation.   [  MB]  2213 Patient's chest x-ray interpreted by me as no acute findings.   [MB]  2218 Patient CT [facility] shows no evidence of PE and they do not see any obvious mediastinal or tracheal esophageal abnormalities.   [MB]  2239 Delta troponin negative.  Reviewed with patient and she is comfortable with discharge.   [MB]    Clinical Course User Index [MB] Hayden Rasmussen, MD        Final Clinical Impressions(s) / ED Diagnoses   Final diagnoses:  Nonspecific chest pain    ED Discharge Orders    None       Hayden Rasmussen, MD 01/13/19 1053

## 2019-01-12 NOTE — Telephone Encounter (Signed)
Pt called stating she has been having a pressure sensation around her collar bone for the past few days.  She denies frank chest pain.  She is 2 wks s/p thyroid surgery.  We discussed that if she is having chest pain, she should be evaluated in the ED.  She feels the pain/pressure is up higher and is wondering if it is related to her surgery.  She denies any swelling or masses around her incision and is not having trouble swallowing.  I will notify her surgeon.    Rosario Adie, MD  [facility]

## 2019-01-12 NOTE — Discharge Instructions (Signed)
You were seen in the emergency department for upper chest and neck pressure.  You had blood work chest x-ray EKG and a CAT scan of your chest that did not show any serious findings.  It will be important for you to schedule follow-up with your primary care doctor.  Please return to the emergency department if any worsening or concerning symptoms.

## 2019-01-12 NOTE — ED Triage Notes (Signed)
Pt states she has had chest pain since 1 week after her tyroid removal . Pt states that she has had problems with gas in the past, but states this has not subsided

## 2019-01-14 ENCOUNTER — Other Ambulatory Visit: Payer: Self-pay

## 2019-01-16 ENCOUNTER — Encounter: Payer: Self-pay | Admitting: Internal Medicine

## 2019-01-16 ENCOUNTER — Ambulatory Visit (INDEPENDENT_AMBULATORY_CARE_PROVIDER_SITE_OTHER): Payer: BC Managed Care – PPO | Admitting: Internal Medicine

## 2019-01-16 VITALS — BP 122/84 | HR 71 | Temp 98.5°F | Ht 70.0 in | Wt 275.0 lb

## 2019-01-16 DIAGNOSIS — C73 Malignant neoplasm of thyroid gland: Secondary | ICD-10-CM

## 2019-01-16 DIAGNOSIS — E890 Postprocedural hypothyroidism: Secondary | ICD-10-CM | POA: Insufficient documentation

## 2019-01-16 DIAGNOSIS — F419 Anxiety disorder, unspecified: Secondary | ICD-10-CM

## 2019-01-16 DIAGNOSIS — E559 Vitamin D deficiency, unspecified: Secondary | ICD-10-CM

## 2019-01-16 MED ORDER — LEVOTHYROXINE SODIUM 150 MCG PO TABS: 150.0000 ug | ORAL_TABLET | Freq: Every day | ORAL | 2 refills | Status: DC

## 2019-01-16 NOTE — Patient Instructions (Signed)

## 2019-01-16 NOTE — Progress Notes (Signed)
Name: Tara Ferguson  MRN/ DOB: [id number], 1980/08/18    Age/ Sex: 38 y.o., female     PCP: Kathyrn Lass, MD   Reason for Endocrinology Evaluation: [facility]     Initial Endocrinology Clinic Visit: 12/11/2018    PATIENT IDENTIFIER: Tara Ferguson is a 38 y.o., female with a past medical history of PTC. She has followed with [facility] since 12/11/2018 for consultative assistance with management of her PTC.   HISTORICAL SUMMARY: The patient noted a palpable abnormality around the right side of her neck area sometime around 10/2018 which prompted a thyroid ultrasound showing multiple thyroid nodules. Two nodules met criteria for FNA. 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   SUBJECTIVE:   Today (01/16/2019):  Tara Ferguson is here for a follow up on recent diagnosis of PTC. She is S/P total thyroidectomy on 12/24/2018.  Denies any neck pain or tenderness except a little area to the left . Post=op required calcium infusion but denies perioral tingling or numbness, denies muscle spasms Has chest tightness and pressure was evaluated in the ED , no evidence of PE or cardiac origin.    Currently on Levothyroxine 100 mcg daily   Stopped anxiety medicine due to chest pains, unknown exacerbating or relieving factors.    No constipation but has noted more fatigue then usual.  Minimal depression      ROS:  As per HPI.   HISTORY:  Past Medical History:  Past Medical History:  Diagnosis Date  . Carpal tunnel syndrome, bilateral   . Depression   . Hyperhidrosis   . Migraines   . Multinodular goiter   . Pneumonia    history of  . PONV (postoperative nausea and vomiting)   . Thyroid cancer ([place])    Papillary Thyroid Carcinoma     Past Surgical History:  Past Surgical  History:  Procedure Laterality Date  . BACK SURGERY    . SYMPATHECTOMY    . THYROIDECTOMY N/A 12/24/2018   Procedure: TOTAL THYROIDECTOMY;  Surgeon: Armandina Gemma, MD;  Location: [facility];  Service: General;  Laterality: N/A;     Social History:  reports that she has never smoked. She has never used smokeless tobacco. She reports that she does not drink alcohol or use drugs. Family History:  Family History  Problem Relation Age of Onset  . Depression Father   . Bipolar disorder Father   . Suicidality Father   . ADD / ADHD Father   . Migraines Brother   . ADD / ADHD Brother   . Depression Maternal Grandmother       HOME MEDICATIONS: Allergies as of 01/16/2019      Reactions   Tape Other (See Comments)   Clear surgical tape causes blisters   Wellbutrin [bupropion]    Highly suspicious for causing headaches per pt       Medication List       Accurate as of January 16, 2019  3:32 PM. If you have any questions, ask your nurse or doctor.        STOP taking these medications   methylphenidate 54 MG CR tablet Commonly known as: Concerta Stopped by: Dorita Sciara, MD   phentermine 37.5 MG tablet Commonly known as: ADIPEX-P Stopped by: Dorita Sciara, MD   sertraline 100  MG tablet Commonly known as: Zoloft Stopped by: Dorita Sciara, MD     TAKE these medications   calcium carbonate 500 MG chewable tablet Commonly known as: Tums Chew 2 tablets (400 mg of elemental calcium total) by mouth 4 (four) times daily.   HYDROcodone-acetaminophen 5-325 MG tablet Commonly known as: NORCO/VICODIN Take 1-2 tablets by mouth every 4 (four) hours as needed for moderate pain.   HYDROcodone-acetaminophen 5-325 MG tablet Commonly known as: NORCO/VICODIN Take 1-2 tablets by mouth every 4 (four) hours as needed for moderate pain.   ibuprofen 200 MG tablet Commonly known as: ADVIL Take 800 mg by mouth every 8 (eight) hours as needed. Pain   levothyroxine 100 MCG  tablet Commonly known as: Synthroid Take 1 tablet (100 mcg total) by mouth daily.   prochlorperazine 10 MG tablet Commonly known as: Compazine Take 1 tablet (10 mg total) by mouth every 6 (six) hours as needed for nausea or vomiting.         OBJECTIVE:   PHYSICAL EXAM: VS: BP 122/84 (BP Location: Left Arm, Patient Position: Sitting, Cuff Size: Large)   Pulse 71   Temp 98.5 F (36.9 C) (Oral)   Ht _0  (1.778 m)   Wt 275 lb (124.7 kg)   SpO2 99%   BMI 39.46 kg/m    EXAM: General: Pt appears well and is in NAD  Hydration: Well-hydrated with moist mucous membranes and good skin turgor  Eyes: External eye exam normal without stare, lid lag or exophthalmos.  EOM intact.  PERRL.  Ears, Nose, Throat: Hearing: Grossly intact bilaterally Dental: Good dentition  Throat: Clear without mass, erythema or exudate  Neck: General: Supple without adenopathy. Thyroid: Thyroid surgically removed, anterior neck scar , clean.   Lungs: Clear with good BS bilat with no rales, rhonchi, or wheezes  Heart: Auscultation: RRR.  Abdomen: Normoactive bowel sounds, soft, nontender, without masses or organomegaly palpable  Extremities:  BL LE: No pretibial edema normal ROM and strength.  Neuro: Cranial nerves: II - XII grossly intact  Chvostek's sign positive B/L Motor: Normal strength throughout DTRs: 2+ and symmetric in UE without delay in relaxation phase  Mental Status: Judgment, insight: Intact Orientation: Oriented to time, place, and person Mood and affect: No depression, anxiety, or agitation     DATA REVIEWED:  A. THYROID, TOTAL THYROIDECTOMY:  - Papillary thyroid carcinoma, 1.3 cm.  - Carcinoma appears confined to the thyroid.  - Background follicular adenomas.  - The surgical resection margins are negative for carcinoma.  - See oncology table below   B. LYMPH NODE, CENTRAL COMPARTMENT (ZONE VI), BIOPSY:  - Metastatic papillary thyroid carcinoma in 1 of 1 lymph node (1/1).     ONCOLOGY TABLE:    Procedure: Thyroidectomy  Tumor Focality: Unifocal  Tumor Site: Between the isthmus and left lobe  Tumor Size: 1.3 cm  Histologic Type: Papillary thyroid carcinoma  Margins: Uninvolved by tumor  Angioinvasion: Not identified  Lymphatic Invasion: Not identified  Extrathyroidal extension: Not identified  Regional Lymph Nodes:    Number of Lymph Nodes Involved: 1    Nodal Levels Involved: Central compartment    Size of Largest Metastatic Deposit: 1.3 cm    Extranodal Extension (ENE): Not identified    Number of Lymph Nodes Examined: 1    Nodal Levels Examined: Unknown  Pathologic Stage Classification (pTNM, AJCC 8th Edition): pT1B, pN1   Results for WAKISHA, ALBERTS (MRN [id number]) as of 01/19/2019 20:19  Ref. Range 01/16/2019 16:27  Calcium Latest  Ref Range: 8.4 - 10.5 mg/dL 9.0  Albumin Latest Ref Range: 3.5 - 5.2 g/dL 4.4  VITD Latest Ref Range: 30.00 - 100.00 ng/mL 17.04 (L)     ASSESSMENT / PLAN / RECOMMENDATIONS:   1. Papillary Thyroid Cancer (pT1B, pN1 ) Stage I:   - S/P total thyroidectomy 12/24/2018 - Pt with non-specific symptoms of fatigue, chest tightness and anxiety.  - Will increase current LT-4 replacement to weight base of 150 mcg daily ) - She has been healing well from surgery. - Currently she is at moderate risk for recurrence due to metastatic 1.3 cm of a central L.N  - TSh goal 0.1-0.5 uIU/mL  - We discussed RAI ablation, and that per ATA  There's conflicting evidence of benefit, pt did her research and agrees to wait on this at this time.  - Will check TSH, Tg, Tg Ab's and perform thyroid bed ultrasound in 3 weeks.  - Will consider the need for WBS or stimulated TSH following above results.   2. Postoperative Hypothyroidism :  - Pt with some fatigue  - Too soon to check TFT's today - Will increase LT-4 replacement based on weight requirement -Pt educated extensively on the correct way to take levothyroxine (first  thing in the morning with water, 30 minutes before eating or taking other medications). - Pt encouraged to double dose the following day if she were to miss a dose given long half-life of levothyroxine.   Medications  Increase Levothyroxine 150 mcg daily    3. Vitamin D Deficiency:  - Will replenish with OTC D3 at 2000 iu daily  - Corrected calcium 8.68 mg/dL - normal    4. Anxiety :   - Pt has tapered off Concerta and Zoloft. She continues to have issues with anxiety.  - I have advised her to restart Zoloft at half a tablet for 3 weeks, then may increase to a full tablet .  - Pt may remain off concerta at this time.   Signed electronically by: Mack Guise, MD  [facility]  [facility] [address] [place], [place] [address] Phone: [phone] FAX: [phone]       Loletha Grayer: Kathyrn Lass, MD [place] [address] Phone: [phone]  Fax: [phone]   Return to Endocrinology clinic as below: Future Appointments  Date Time Provider [place]  01/21/2019  2:30 PM Thayer Headings, PMHNP [facility] None  02/03/2019  3:40 PM , Melanie Crazier, MD [facility] None

## 2019-01-17 LAB — ALBUMIN: Albumin: 4.4 g/dL (ref 3.5–5.2)

## 2019-01-17 LAB — CALCIUM: Calcium: 9 mg/dL (ref 8.4–10.5)

## 2019-01-17 LAB — VITAMIN D 25 HYDROXY (VIT D DEFICIENCY, FRACTURES): VITD: 17.04 ng/mL — ABNORMAL LOW (ref 30.00–100.00)

## 2019-01-19 DIAGNOSIS — F419 Anxiety disorder, unspecified: Secondary | ICD-10-CM | POA: Insufficient documentation

## 2019-01-19 DIAGNOSIS — E559 Vitamin D deficiency, unspecified: Secondary | ICD-10-CM | POA: Insufficient documentation

## 2019-01-21 ENCOUNTER — Other Ambulatory Visit: Payer: Self-pay

## 2019-01-21 ENCOUNTER — Ambulatory Visit (INDEPENDENT_AMBULATORY_CARE_PROVIDER_SITE_OTHER): Payer: BC Managed Care – PPO | Admitting: Psychiatry

## 2019-01-21 ENCOUNTER — Encounter: Payer: Self-pay | Admitting: Psychiatry

## 2019-01-21 DIAGNOSIS — F4312 Post-traumatic stress disorder, chronic: Secondary | ICD-10-CM

## 2019-01-21 DIAGNOSIS — F4010 Social phobia, unspecified: Secondary | ICD-10-CM

## 2019-01-21 DIAGNOSIS — F900 Attention-deficit hyperactivity disorder, predominantly inattentive type: Secondary | ICD-10-CM | POA: Diagnosis not present

## 2019-01-21 DIAGNOSIS — F422 Mixed obsessional thoughts and acts: Secondary | ICD-10-CM

## 2019-01-21 MED ORDER — SERTRALINE HCL 100 MG PO TABS: 200.0000 mg | ORAL_TABLET | Freq: Every day | ORAL | 1 refills | Status: DC

## 2019-01-21 MED ORDER — ADHANSIA XR 45 MG PO CP24: 45.0000 mg | ORAL_CAPSULE | Freq: Every morning | ORAL | 0 refills | Status: DC

## 2019-01-21 NOTE — Progress Notes (Signed)
Tara Ferguson [id number] January 08, 1981 38 y.o.  Virtual Visit via Telephone Note  I connected with pt on 01/21/19 at  2:30 PM EST by telephone and verified that I am speaking with the correct person using two identifiers.   I discussed the limitations, risks, security and privacy concerns of performing an evaluation and management service by telephone and the availability of in person appointments. I also discussed with the patient that there may be a patient responsible charge related to this service. The patient expressed understanding and agreed to proceed.   I discussed the assessment and treatment plan with the patient. The patient was provided an opportunity to ask questions and all were answered. The patient agreed with the plan and demonstrated an understanding of the instructions.   The patient was advised to call back or seek an in-person evaluation if the symptoms worsen or if the condition fails to improve as anticipated.  I provided 25 minutes of non-face-to-face time during this encounter.  The patient was located at home.  The provider was located at [place].   Thayer Headings, PMHNP   Subjective:   Patient ID:  Tara Ferguson is a 38 y.o. (DOB 1980-09-18) female.  Chief Complaint:  Chief Complaint  Patient presents with  . ADD  . Anxiety    HPI Tara Ferguson presents for follow-up of anxiety, depression, and ADD. She reports that she was dx'd with thyroid cancer in August and had surgery and treatment for this. She is going to be returning to work. She reports that she has been tired since medical issues.   She reports that she thinks Sertraline has been helpful for her anxiety. She reports that she stopped taking Sertraline for 3-4 days since she was not sure if it was contributing to chest pain, and then re-started it at the advice of her endocrinologist. She reports that she has had a few panic attacks at times because physical s/s with surgery have  caused sensation at times of being choked. She reports that her anxiety had been well controlled before surgery and has had some situational stress with surgery. Denies depressed mood. Has had some irritability which she attributes to circumstances. She reports that her sleep has been disrupted with difficulty falling and staying asleep. Reports that she is not feeling rested upon awakening. Denies nightmares. Describes some hyper-vigilance when trying to fall asleep. Appetite has been decreased since surgery. Motivation was improved with Concerta and then would be low around 3 pm. She reports that she has been having increased concentration difficulties recently. Reports concentration was improved with Concerta and denies any tolerability issues. Denies SI.   She reports that Concerta was effective for ADD but would experience a "slump" in the middle of the day. She reports that she was advised to stop Concerta before surgery on 12/24/18. Has not taken Concerta for about a week.   Her typical work day is 8 hours.   Has a new 67 month old foster infant. Their 2 children are going to school virtually. Wife has been teaching full-time. She reports that their families have been supportive and helping with child care. Has been seeing a therapist regularly.   Past medication trials: Prozac-no improvement Effexor-ineffective Sertraline- Effective for anxiety Wellbutrin-headaches, worsening irritability Latuda-ineffective, nausea,vomiting Vraylar Topamax-taken for headaches Lamictal- Ineffective Lithium Trileptal BuSpar Concerta- Helpful for Concentration, Motivation, and energy   Review of Systems:  Review of Systems  Constitutional: Positive for fatigue.  Cardiovascular: Positive for chest pain.  Was seen in ER on 11/1 for chest pain and cardiac work-up was unremarkable and chest pain was thought to be related to healing from surgery and anxiety.   Endocrine:       Recent surgery for  thyroid carcinoma.   Musculoskeletal: Negative for gait problem.  Neurological: Negative for tremors.  Psychiatric/Behavioral:       Please refer to HPI    Medications: I have reviewed the patient's current medications.  Current Outpatient Medications  Medication Sig Dispense Refill  . ibuprofen (ADVIL,MOTRIN) 200 MG tablet Take 800 mg by mouth every 8 (eight) hours as needed. Pain    . levothyroxine (SYNTHROID) 150 MCG tablet Take 1 tablet (150 mcg total) by mouth daily. 60 tablet 2  . sertraline (ZOLOFT) 100 MG tablet Take 2 tablets (200 mg total) by mouth daily. 180 tablet 1  . HYDROcodone-acetaminophen (NORCO/VICODIN) 5-325 MG tablet Take 1-2 tablets by mouth every 4 (four) hours as needed for moderate pain. (Patient not taking: Reported on 01/21/2019) 20 tablet 0  . HYDROcodone-acetaminophen (NORCO/VICODIN) 5-325 MG tablet Take 1-2 tablets by mouth every 4 (four) hours as needed for moderate pain. (Patient not taking: Reported on 01/21/2019) 20 tablet 0  . Methylphenidate HCl ER (ADHANSIA XR) 45 MG CP24 Take 45 mg by mouth every morning. 30 capsule 0  . prochlorperazine (COMPAZINE) 10 MG tablet Take 1 tablet (10 mg total) by mouth every 6 (six) hours as needed for nausea or vomiting. (Patient not taking: Reported on 01/21/2019) 30 tablet 11   No current facility-administered medications for this visit.     Medication Side Effects: None  Allergies:  Allergies  Allergen Reactions  . Tape Other (See Comments)    Clear surgical tape causes blisters  . Wellbutrin [Bupropion]     Highly suspicious for causing headaches per pt     Past Medical History:  Diagnosis Date  . Carpal tunnel syndrome, bilateral   . Depression   . Hyperhidrosis   . Migraines   . Multinodular goiter   . Pneumonia    history of  . PONV (postoperative nausea and vomiting)   . Thyroid cancer ([place])    Papillary Thyroid Carcinoma     Family History  Problem Relation Age of Onset  . Depression Father    . Bipolar disorder Father   . Suicidality Father   . ADD / ADHD Father   . Migraines Brother   . ADD / ADHD Brother   . Depression Maternal Grandmother     Social History   Socioeconomic History  . Marital status: Married    Spouse name: Not on file  . Number of children: 2  . Years of education: 4  . Highest education level: Not on file  Occupational History  . Occupation: Nanny  . Occupation: [employer]  . Occupation: Ship broker    Comment: Business  Social Needs  . Financial resource strain: Not on file  . Food insecurity    Worry: Not on file    Inability: Not on file  . Transportation needs    Medical: Not on file    Non-medical: Not on file  Tobacco Use  . Smoking status: Never Smoker  . Smokeless tobacco: Never Used  Substance and Sexual Activity  . Alcohol use: No  . Drug use: No  . Sexual activity: Not on file  Lifestyle  . Physical activity    Days per week: Not on file    Minutes per session: Not  on file  . Stress: Not on file  Relationships  . Social Herbalist on phone: Not on file    Gets together: Not on file    Attends religious service: Not on file    Active member of club or organization: Not on file    Attends meetings of clubs or organizations: Not on file    Relationship status: Not on file  . Intimate partner violence    Fear of current or ex partner: Not on file    Emotionally abused: Not on file    Physically abused: Not on file    Forced sexual activity: Not on file  Other Topics Concern  . Not on file  Social History Narrative   ** Merged History Encounter **        Past Medical History, Surgical history, Social history, and Family history were reviewed and updated as appropriate.   Please see review of systems for further details on the patient's review from today.   Objective:   Physical Exam:  There were no vitals taken for this visit.  Physical Exam Constitutional:      General: She is not  in acute distress.    Appearance: She is well-developed.  Musculoskeletal:        General: No deformity.  Neurological:     Mental Status: She is alert and oriented to person, place, and time.     Coordination: Coordination normal.  Psychiatric:        Attention and Perception: Perception normal. She is inattentive. She does not perceive auditory or visual hallucinations.        Mood and Affect: Mood normal. Mood is not anxious or depressed. Affect is not labile, blunt, angry or inappropriate.        Speech: Speech normal.        Behavior: Behavior normal.        Thought Content: Thought content normal. Thought content does not include homicidal or suicidal ideation. Thought content does not include homicidal or suicidal plan.        Cognition and Memory: Cognition and memory normal.        Judgment: Judgment normal.     Comments: Insight intact. No delusions.      Lab Review:     Component Value Date/Time   NA 136 01/12/2019 1618   NA 144 02/07/2016 0901   K 3.7 01/12/2019 1618   CL 105 01/12/2019 1618   CO2 21 (L) 01/12/2019 1618   GLUCOSE 100 (H) 01/12/2019 1618   BUN 10 01/12/2019 1618   BUN 8 02/07/2016 0901   CREATININE 0.75 01/12/2019 1618   CALCIUM 9.0 01/16/2019 1627   PROT 6.9 02/07/2016 0901   ALBUMIN 4.4 01/16/2019 1627   ALBUMIN 4.5 02/07/2016 0901   AST 24 02/07/2016 0901   ALT 34 (H) 02/07/2016 0901   ALKPHOS 97 02/07/2016 0901   BILITOT 0.3 02/07/2016 0901   GFRNONAA >60 01/12/2019 1618   GFRAA >60 01/12/2019 1618       Component Value Date/Time   WBC 8.9 01/12/2019 1618   RBC 4.46 01/12/2019 1618   HGB 14.1 01/12/2019 1618   HGB 13.2 02/07/2016 0901   HCT 41.4 01/12/2019 1618   HCT 38.9 02/07/2016 0901   PLT 277 01/12/2019 1618   PLT 219 02/07/2016 0901   MCV 92.8 01/12/2019 1618   MCV 93 02/07/2016 0901   MCH 31.6 01/12/2019 1618   MCHC 34.1 01/12/2019 1618   RDW 13.8  01/12/2019 1618   RDW 13.6 02/07/2016 0901    No results found for:  POCLITH, LITHIUM   No results found for: PHENYTOIN, PHENOBARB, VALPROATE, CBMZ   .res Assessment: Plan:   Pt seen for 30 minutes and greater than 50% of visit spent counseling pt and coordination of care to include review record from hospitalization, ED visit, and endocrinologist. Pt requests to resume stimulant similar to Concerta with longer duration since she has had worsening concentration and energy since stopping Concerta and chest pain has been thought to be musculoskeletal and cardiac work up was negative.  Discussed potential benefits, risks, and side effects of Adhansia since this would have a longer duration compared to Concerta. Pt advised to notify office if she experiences worsening anxiety, irritability, or insomnia. Advised to take Adhansia as early as possible to minimize risk for sleep disturbance.  Will start Adhansia XR 45 mg po q am for ADHD. Will continue Sertraline 200 mg po qd for anxiety and depression.  Pt to f/u in 4 weeks or sooner if clinically indicated.  Patient advised to contact office with any questions, adverse effects, or acute worsening in signs and symptoms.  Morag was seen today for add and anxiety.  Diagnoses and all orders for this visit:  Attention deficit hyperactivity disorder (ADHD), predominantly inattentive type -     Methylphenidate HCl ER (ADHANSIA XR) 45 MG CP24; Take 45 mg by mouth every morning.  Prolonged posttraumatic stress disorder -     sertraline (ZOLOFT) 100 MG tablet; Take 2 tablets (200 mg total) by mouth daily.  Mixed obsessional thoughts and acts -     sertraline (ZOLOFT) 100 MG tablet; Take 2 tablets (200 mg total) by mouth daily.  Social anxiety disorder -     sertraline (ZOLOFT) 100 MG tablet; Take 2 tablets (200 mg total) by mouth daily.    Please see After Visit Summary for patient specific instructions.  Future Appointments  Date Time Provider [place]  02/03/2019 10:45 AM [facility]  None  05/20/2019  7:50 AM Shamleffer, Melanie Crazier, MD [facility] None    No orders of the defined types were placed in this encounter.     -------------------------------

## 2019-01-24 ENCOUNTER — Encounter: Payer: Self-pay | Admitting: Internal Medicine

## 2019-02-03 ENCOUNTER — Ambulatory Visit: Payer: BC Managed Care – PPO | Admitting: Internal Medicine

## 2019-02-03 ENCOUNTER — Other Ambulatory Visit: Payer: Self-pay

## 2019-02-03 ENCOUNTER — Other Ambulatory Visit (INDEPENDENT_AMBULATORY_CARE_PROVIDER_SITE_OTHER): Payer: BC Managed Care – PPO

## 2019-02-03 DIAGNOSIS — C73 Malignant neoplasm of thyroid gland: Secondary | ICD-10-CM | POA: Diagnosis not present

## 2019-02-03 LAB — T4, FREE: Free T4: 0.9 ng/dL (ref 0.60–1.60)

## 2019-02-03 LAB — TSH: TSH: 24.64 u[IU]/mL — ABNORMAL HIGH (ref 0.35–4.50)

## 2019-02-04 LAB — THYROGLOBULIN ANTIBODY: Thyroglobulin Ab: <1 IU/mL (ref <=1)

## 2019-02-04 LAB — THYROGLOBULIN LEVEL: Thyroglobulin: 2.3 ng/mL — ABNORMAL LOW

## 2019-02-05 ENCOUNTER — Other Ambulatory Visit: Payer: Self-pay | Admitting: Internal Medicine

## 2019-02-05 MED ORDER — LEVOTHYROXINE SODIUM 150 MCG PO TABS: 150.0000 ug | ORAL_TABLET | ORAL | 2 refills | Status: DC

## 2019-02-11 ENCOUNTER — Ambulatory Visit
Admission: RE | Admit: 2019-02-11 | Discharge: 2019-02-11 | Disposition: A | Discharge status: Home / Self Care | Payer: BC Managed Care – PPO | Source: Ambulatory Visit | Attending: Internal Medicine | Admitting: Internal Medicine

## 2019-02-11 DIAGNOSIS — C73 Malignant neoplasm of thyroid gland: Secondary | ICD-10-CM | POA: Diagnosis not present

## 2019-02-11 DIAGNOSIS — E890 Postprocedural hypothyroidism: Secondary | ICD-10-CM

## 2019-02-13 ENCOUNTER — Telehealth: Payer: Self-pay

## 2019-02-13 NOTE — Telephone Encounter (Signed)
Prior authorization submitted and denied for ADHANSIA XR 45 mg through [facility], they require patient try and fail 3 of their formulary alternatives such as Adderall XR, Vyvanse, Concerta, Mydayis, Methylphenidate ER, Dextroamphetamine XR, or Dextromethylphenidate XR.   Patient was given a copay card for this and was able to pick for $15.00, patient will continue to do this. Tara Ferguson also aware.

## 2019-03-12 DIAGNOSIS — R0981 Nasal congestion: Secondary | ICD-10-CM | POA: Diagnosis not present

## 2019-03-12 DIAGNOSIS — R5381 Other malaise: Secondary | ICD-10-CM | POA: Diagnosis not present

## 2019-03-12 DIAGNOSIS — U071 COVID-19: Secondary | ICD-10-CM | POA: Diagnosis not present

## 2019-03-12 DIAGNOSIS — R52 Pain, unspecified: Secondary | ICD-10-CM | POA: Diagnosis not present

## 2019-03-12 DIAGNOSIS — R6883 Chills (without fever): Secondary | ICD-10-CM | POA: Diagnosis not present

## 2019-03-12 DIAGNOSIS — R5383 Other fatigue: Secondary | ICD-10-CM | POA: Diagnosis not present

## 2019-03-18 DIAGNOSIS — U071 COVID-19: Secondary | ICD-10-CM | POA: Diagnosis not present

## 2019-03-20 ENCOUNTER — Telehealth: Payer: Self-pay

## 2019-03-20 NOTE — Telephone Encounter (Signed)
Spoke to pt and she stated that she understood reasoning for reaching out to surgeon or PCP for her issue and that she will call their office.

## 2019-03-20 NOTE — Telephone Encounter (Signed)
Pt stated that she has been having some discomfort in her throat and chest since her surgery, pt only messaged the office about hot flashes previously. She wanted to know if there is something that can be done for this, pt also informed me that she has tested positive for Covid and that is causing these symptoms to become worse.Please advise.

## 2019-03-20 NOTE — Telephone Encounter (Signed)
Pt stated that she was not having the chest discomfort(not pain) until after the surgery nor was she having the throat discomfort until after the surgery. Pt also stated that she spoke to surgeon and her PCP about that already. Please advise.

## 2019-03-20 NOTE — Telephone Encounter (Signed)
Patient called in wanting to speak with nurse about some concerns about her health and wants to speak with nurse or Dr    Please call and advise

## 2019-04-10 DIAGNOSIS — D1801 Hemangioma of skin and subcutaneous tissue: Secondary | ICD-10-CM | POA: Diagnosis not present

## 2019-04-10 DIAGNOSIS — K130 Diseases of lips: Secondary | ICD-10-CM | POA: Diagnosis not present

## 2019-04-10 DIAGNOSIS — L918 Other hypertrophic disorders of the skin: Secondary | ICD-10-CM | POA: Diagnosis not present

## 2019-04-28 DIAGNOSIS — N3946 Mixed incontinence: Secondary | ICD-10-CM | POA: Diagnosis not present

## 2019-05-08 DIAGNOSIS — C73 Malignant neoplasm of thyroid gland: Secondary | ICD-10-CM | POA: Diagnosis not present

## 2019-05-08 DIAGNOSIS — E890 Postprocedural hypothyroidism: Secondary | ICD-10-CM | POA: Diagnosis not present

## 2019-05-20 ENCOUNTER — Encounter: Payer: Self-pay | Admitting: Psychiatry

## 2019-05-20 ENCOUNTER — Other Ambulatory Visit (HOSPITAL_COMMUNITY): Payer: Self-pay | Admitting: Endocrinology

## 2019-05-20 ENCOUNTER — Ambulatory Visit (INDEPENDENT_AMBULATORY_CARE_PROVIDER_SITE_OTHER): Payer: BC Managed Care – PPO | Admitting: Psychiatry

## 2019-05-20 ENCOUNTER — Other Ambulatory Visit: Payer: Self-pay

## 2019-05-20 ENCOUNTER — Ambulatory Visit: Payer: BC Managed Care – PPO | Admitting: Internal Medicine

## 2019-05-20 VITALS — BP 135/87 | HR 63

## 2019-05-20 DIAGNOSIS — F422 Mixed obsessional thoughts and acts: Secondary | ICD-10-CM

## 2019-05-20 DIAGNOSIS — F4010 Social phobia, unspecified: Secondary | ICD-10-CM | POA: Diagnosis not present

## 2019-05-20 DIAGNOSIS — F4312 Post-traumatic stress disorder, chronic: Secondary | ICD-10-CM

## 2019-05-20 DIAGNOSIS — F5081 Binge eating disorder: Secondary | ICD-10-CM

## 2019-05-20 DIAGNOSIS — C73 Malignant neoplasm of thyroid gland: Secondary | ICD-10-CM

## 2019-05-20 DIAGNOSIS — F900 Attention-deficit hyperactivity disorder, predominantly inattentive type: Secondary | ICD-10-CM

## 2019-05-20 DIAGNOSIS — Z0289 Encounter for other administrative examinations: Secondary | ICD-10-CM

## 2019-05-20 MED ORDER — SERTRALINE HCL 100 MG PO TABS: 200.0000 mg | ORAL_TABLET | Freq: Every day | ORAL | 1 refills | Status: DC

## 2019-05-20 MED ORDER — LISDEXAMFETAMINE DIMESYLATE 30 MG PO CAPS: 30.0000 mg | ORAL_CAPSULE | Freq: Every day | ORAL | 0 refills | Status: DC

## 2019-05-20 NOTE — Progress Notes (Deleted)
Name: Tara Ferguson  MRN/ DOB: [id number], [birth year]    Age/ Sex: 39 y.o., female     PCP: Kathyrn Lass, MD   Reason for Endocrinology Evaluation: [facility]     Initial Endocrinology Clinic Visit: 12/11/2018    PATIENT IDENTIFIER: [name] is a 39 y.o., female with a past medical history of PTC. She has followed with [facility] since 12/11/2018 for consultative assistance with management of her PTC.   HISTORICAL SUMMARY: The patient noted a palpable abnormality around the right side of her neck area sometime around 10/2018 which prompted a thyroid ultrasound showing multiple thyroid nodules. Two nodules met criteria for FNA. 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   SUBJECTIVE:   During last visit (01/16/2019): We increased LT-4 replacement.      Today (05/20/2019):  [name] is here for a follow up on recent diagnosis of PTC. She is S/P total thyroidectomy on 12/24/2018.  Denies any neck pain or tenderness except a little area to the left . Post=op required calcium infusion but denies perioral tingling or numbness, denies muscle spasms Has chest tightness and pressure was evaluated in the ED , no evidence of PE or cardiac origin.    Currently on Levothyroxine 100 mcg daily   Stopped anxiety medicine due to chest pains, unknown exacerbating or relieving factors.    No constipation but has noted more fatigue then usual.  Minimal depression      ROS:  As per HPI.   HISTORY:  Past Medical History:  Past Medical History:  Diagnosis Date  . Carpal tunnel syndrome, bilateral   . Depression   . Hyperhidrosis   . Migraines   . Multinodular goiter   . Pneumonia    history of  . PONV (postoperative nausea and vomiting)   . Thyroid cancer ([place])    Papillary Thyroid Carcinoma    Past Surgical History:  Past Surgical History:  Procedure Laterality Date  . BACK SURGERY    . SYMPATHECTOMY    . THYROIDECTOMY N/A 12/24/2018   Procedure: TOTAL THYROIDECTOMY;  Surgeon: Armandina Gemma, MD;  Location: [facility];  Service: General;  Laterality: N/A;    Social History:  reports that she has never smoked. She has never used smokeless tobacco. She reports that she does not drink alcohol or use drugs. Family History:  Family History  Problem Relation Age of Onset  . Depression Father   . Bipolar disorder Father   . Suicidality Father   . ADD / ADHD Father   . Migraines Brother   . ADD / ADHD Brother   . Depression Maternal Grandmother      HOME MEDICATIONS: Allergies as of 05/20/2019      Reactions   Tape Other (See Comments)   Clear surgical tape causes blisters   Wellbutrin [bupropion]    Highly suspicious for causing headaches per pt       Medication List       Accurate as of May 20, 2019  7:34 AM. If you have any questions, ask your nurse or doctor.        Adhansia XR 45 MG Cp24 Generic drug: Methylphenidate HCl ER Take 45 mg by mouth every morning.   HYDROcodone-acetaminophen 5-325 MG tablet Commonly known as: NORCO/VICODIN Take 1-2 tablets by mouth  every 4 (four) hours as needed for moderate pain.   HYDROcodone-acetaminophen 5-325 MG tablet Commonly known as: NORCO/VICODIN Take 1-2 tablets by mouth every 4 (four) hours as needed for moderate pain.   ibuprofen 200 MG tablet Commonly known as: ADVIL Take 800 mg by mouth every 8 (eight) hours as needed. Pain   levothyroxine 150 MCG tablet Commonly known as: SYNTHROID Take 1 tablet (150 mcg total) by mouth as directed. 8 tabs weekly   prochlorperazine 10 MG tablet Commonly known as: Compazine Take 1 tablet (10 mg total) by mouth every 6 (six) hours as needed for nausea or vomiting.   sertraline 100 MG tablet Commonly known as: ZOLOFT Take 2 tablets (200 mg  total) by mouth daily.         OBJECTIVE:   PHYSICAL EXAM: VS: There were no vitals taken for this visit.   EXAM: General: Pt appears well and is in NAD  Hydration: Well-hydrated with moist mucous membranes and good skin turgor  Eyes: External eye exam normal without stare, lid lag or exophthalmos.  EOM intact.  PERRL.  Ears, Nose, Throat: Hearing: Grossly intact bilaterally Dental: Good dentition  Throat: Clear without mass, erythema or exudate  Neck: General: Supple without adenopathy. Thyroid: Thyroid surgically removed, anterior neck scar , clean.   Lungs: Clear with good BS bilat with no rales, rhonchi, or wheezes  Heart: Auscultation: RRR.  Abdomen: Normoactive bowel sounds, soft, nontender, without masses or organomegaly palpable  Extremities:  BL LE: No pretibial edema normal ROM and strength.  Neuro: Cranial nerves: II - XII grossly intact  Chvostek's sign positive B/L Motor: Normal strength throughout DTRs: 2+ and symmetric in UE without delay in relaxation phase  Mental Status: Judgment, insight: Intact Orientation: Oriented to time, place, and person Mood and affect: No depression, anxiety, or agitation     DATA REVIEWED:  A. THYROID, TOTAL THYROIDECTOMY:  - Papillary thyroid carcinoma, 1.3 cm.  - Carcinoma appears confined to the thyroid.  - Background follicular adenomas.  - The surgical resection margins are negative for carcinoma.  - See oncology table below   B. LYMPH NODE, CENTRAL COMPARTMENT (ZONE VI), BIOPSY:  - Metastatic papillary thyroid carcinoma in 1 of 1 lymph node (1/1).    ONCOLOGY TABLE:    Procedure: Thyroidectomy  Tumor Focality: Unifocal  Tumor Site: Between the isthmus and left lobe  Tumor Size: 1.3 cm  Histologic Type: Papillary thyroid carcinoma  Margins: Uninvolved by tumor  Angioinvasion: Not identified  Lymphatic Invasion: Not identified  Extrathyroidal extension: Not identified  Regional Lymph Nodes:    Number of  Lymph Nodes Involved: 1    Nodal Levels Involved: Central compartment    Size of Largest Metastatic Deposit: 1.3 cm    Extranodal Extension (ENE): Not identified    Number of Lymph Nodes Examined: 1    Nodal Levels Examined: Unknown  Pathologic Stage Classification (pTNM, AJCC 8th Edition): pT1B, pN1       ASSESSMENT / PLAN / RECOMMENDATIONS:   1. Papillary Thyroid Cancer (pT1B, pN1 ) Stage I:   - S/P total thyroidectomy 12/24/2018 - Pt with non-specific symptoms of fatigue, chest tightness and anxiety.  - Will increase current LT-4 replacement to weight base of 150 mcg daily ) - She has been healing well from surgery. - Currently she is at moderate risk for recurrence due to metastatic 1.3 cm of a central L.N  - TSh goal 0.1-0.5 uIU/mL  - We discussed RAI ablation, and that per ATA  There's conflicting evidence of benefit, pt did her research and agrees to wait on this at this time.  - Will check TSH, Tg, Tg Ab's and perform thyroid bed ultrasound in 3 weeks.  - Will consider the need for WBS or stimulated TSH following above results.   2. Postoperative Hypothyroidism :  - Pt with some fatigue  - Too soon to check TFT's today - Will increase LT-4 replacement based on weight requirement -Pt educated extensively on the correct way to take levothyroxine (first thing in the morning with water, 30 minutes before eating or taking other medications). - Pt encouraged to double dose the following day if she were to miss a dose given long half-life of levothyroxine.   Medications  Increase Levothyroxine 150 mcg daily     Signed electronically by: Mack Guise, MD  [facility]  [facility] [address] [place], [place] [address] Phone: [phone] FAX: [phone]       Loletha Grayer: Kathyrn Lass, MD [place] [address] Phone: [phone]  Fax: [phone]   Return to Endocrinology clinic as  below: Future Appointments  Date Time Provider [place]  05/20/2019  7:50 AM Danaye Sobh, Melanie Crazier, MD [facility] None  05/20/2019  9:00 AM Thayer Headings, PMHNP [facility] None

## 2019-05-20 NOTE — Progress Notes (Signed)
Tara Ferguson [id number] [birth year] 39 y.o.  Subjective:   Patient ID:  Tara Ferguson is a 39 y.o. (DOB Mar 12, 1981) female.  Chief Complaint:  Chief Complaint  Patient presents with  . Other    Binge eating  . ADD  . Depression    HPI [name] presents to the office today for follow-up of ADD, anxiety, and depression. She has not noticed a significant difference with Adhansia. Concerta seemed to help initially and then was no longer as effective.   Has been seeing a therapist regularly. Reports that therapist suspects she has bine eating d/o. Pt reports binging on a daily basis and will eat to the point of physical discomfort. She reports that binging is worse when she tries to limit it. She reports emotional eating and that binging is followed by excessive guilt and remorse. Has gained 15 lbs in the last 2 months. She reports that binging occurs throughout the day.   Reports Sertraline has been helpful with catastrophic thinking. Checking behaviors are less severe. Intrusive thoughts and hypervigilance is improved with Sertraline. Occ panic s/s, typically after binge eating. Mood also changes in response to binge eating. She reports that overall her mood is what she would expect considering psychosocial stressors. She reports at times she would like to "shut down" and has been pushing through this. Energy and motivation have been low and "I really fight that." She reports that she is exhausted at the end of the day. She reports that she does not feel rested upon awakening but is sleeping throughout the night. Wife reports that she snores occasionally. She reports dificulty multi-tasking. She reports that she does best when she has only one thing to focus on. Reports feeling overwhelmed with multiple demands which is common at her work. Occ will "snap" at others when there are multiple demands. Denies SI.   Wife has a mass and has MRI scheduled.   Past medication trials: Prozac-no  improvement Effexor-ineffective Sertraline- Effective for anxiety Wellbutrin-headaches, worsening irritability Latuda-ineffective, nausea,vomiting Vraylar Topamax-taken for headaches Lamictal- Ineffective Lithium Trileptal BuSpar Concerta- Helpful for Concentration, Motivation, and energy Adhansia    Review of Systems:  Review of Systems  Endocrine: Positive for polyphagia.       Reports that she feels that hormones are not as stable after having thyroid cancer.  Musculoskeletal: Negative for gait problem.  Skin:       Increased hair loss.  Psychiatric/Behavioral:       Please refer to HPI    Medications: I have reviewed the patient's current medications.  Current Outpatient Medications  Medication Sig Dispense Refill  . EUTHYROX 175 MCG tablet Take 175 mcg by mouth every morning.    [name] levothyroxine (SYNTHROID) 150 MCG tablet Take 1 tablet (150 mcg total) by mouth as directed. 8 tabs weekly (Patient not taking: Reported on 05/20/2019) 34 tablet 2  . lisdexamfetamine (VYVANSE) 30 MG capsule Take 1 capsule (30 mg total) by mouth daily. 30 capsule 0  . oxybutynin (DITROPAN-XL) 5 MG 24 hr tablet Take 5 mg by mouth daily.    . sertraline (ZOLOFT) 100 MG tablet Take 2 tablets (200 mg total) by mouth daily. 180 tablet 1   No current facility-administered medications for this visit.    Medication Side Effects: None  Allergies:  Allergies  Allergen Reactions  . Tape Other (See Comments)    Clear surgical tape causes blisters  . Wellbutrin [Bupropion]     Highly suspicious for causing headaches per pt  Past Medical History:  Diagnosis Date  . Carpal tunnel syndrome, bilateral   . Depression   . Hyperhidrosis   . Migraines   . Multinodular goiter   . Pneumonia    history of  . PONV (postoperative nausea and vomiting)   . Thyroid cancer ([place])    Papillary Thyroid Carcinoma     Family History  Problem Relation Age of Onset  . Depression Father   . Bipolar  disorder Father   . Suicidality Father   . ADD / ADHD Father   . Migraines Brother   . ADD / ADHD Brother   . Depression Maternal Grandmother     Social History   Socioeconomic History  . Marital status: Married    Spouse name: Not on file  . Number of children: 2  . Years of education: 83  . Highest education level: Not on file  Occupational History  . Occupation: Nanny  . Occupation: [employer]  . Occupation: Ship broker    Comment: Business  Tobacco Use  . Smoking status: Never Smoker  . Smokeless tobacco: Never Used  Substance and Sexual Activity  . Alcohol use: No  . Drug use: No  . Sexual activity: Not on file  Other Topics Concern  . Not on file  Social History Narrative   ** Merged History Encounter **       Social Determinants of Health   Financial Resource Strain:   . Difficulty of Paying Living Expenses: Not on file  Food Insecurity:   . Worried About Charity fundraiser in the Last Year: Not on file  . Ran Out of Food in the Last Year: Not on file  Transportation Needs:   . Lack of Transportation (Medical): Not on file  . Lack of Transportation (Non-Medical): Not on file  Physical Activity:   . Days of Exercise per Week: Not on file  . Minutes of Exercise per Session: Not on file  Stress:   . Feeling of Stress : Not on file  Social Connections:   . Frequency of Communication with Friends and Family: Not on file  . Frequency of Social Gatherings with Friends and Family: Not on file  . Attends Religious Services: Not on file  . Active Member of Clubs or Organizations: Not on file  . Attends Archivist Meetings: Not on file  . Marital Status: Not on file  Intimate Partner Violence:   . Fear of Current or Ex-Partner: Not on file  . Emotionally Abused: Not on file  . Physically Abused: Not on file  . Sexually Abused: Not on file    Past Medical History, Surgical history, Social history, and Family history were reviewed and  updated as appropriate.   Please see review of systems for further details on the patient's review from today.   Objective:   Physical Exam:  BP 135/87   Pulse 63   Physical Exam Constitutional:      General: She is not in acute distress. Musculoskeletal:        General: No deformity.  Neurological:     Mental Status: She is alert and oriented to person, place, and time.     Coordination: Coordination normal.  Psychiatric:        Attention and Perception: Attention and perception normal. She does not perceive auditory or visual hallucinations.        Mood and Affect: Mood is anxious. Mood is not depressed. Affect is not labile, blunt,  angry or inappropriate.        Speech: Speech normal.        Behavior: Behavior normal.        Thought Content: Thought content normal. Thought content is not paranoid or delusional. Thought content does not include homicidal or suicidal ideation. Thought content does not include homicidal or suicidal plan.        Cognition and Memory: Cognition and memory normal.        Judgment: Judgment normal.     Comments: Insight intact     Lab Review:     Component Value Date/Time   NA 136 01/12/2019 1618   NA 144 02/07/2016 0901   K 3.7 01/12/2019 1618   CL 105 01/12/2019 1618   CO2 21 (L) 01/12/2019 1618   GLUCOSE 100 (H) 01/12/2019 1618   BUN 10 01/12/2019 1618   BUN 8 02/07/2016 0901   CREATININE 0.75 01/12/2019 1618   CALCIUM 9.0 01/16/2019 1627   PROT 6.9 02/07/2016 0901   ALBUMIN 4.4 01/16/2019 1627   ALBUMIN 4.5 02/07/2016 0901   AST 24 02/07/2016 0901   ALT 34 (H) 02/07/2016 0901   ALKPHOS 97 02/07/2016 0901   BILITOT 0.3 02/07/2016 0901   GFRNONAA >60 01/12/2019 1618   GFRAA >60 01/12/2019 1618       Component Value Date/Time   WBC 8.9 01/12/2019 1618   RBC 4.46 01/12/2019 1618   HGB 14.1 01/12/2019 1618   HGB 13.2 02/07/2016 0901   HCT 41.4 01/12/2019 1618   HCT 38.9 02/07/2016 0901   PLT 277 01/12/2019 1618   PLT 219  02/07/2016 0901   MCV 92.8 01/12/2019 1618   MCV 93 02/07/2016 0901   MCH 31.6 01/12/2019 1618   MCHC 34.1 01/12/2019 1618   RDW 13.8 01/12/2019 1618   RDW 13.6 02/07/2016 0901    No results found for: POCLITH, LITHIUM   No results found for: PHENYTOIN, PHENOBARB, VALPROATE, CBMZ   .res Assessment: Plan:   Discussed potential benefits, risks, and side effects of Vyvanse since it has indications for both binge eating disorder and attention deficit disorder.  Patient agrees to trial of Vyvanse.  Will start Vyvanse 30 mg daily for binge eating disorder and ADD.  Discussed that further titration may be needed based upon response. Continue sertraline 200 mg daily for depression and anxiety. Patient to follow-up in 4 weeks or sooner if clinically indicated. Patient advised to contact office with any questions, adverse effects, or acute worsening in signs and symptoms.  Chairty was seen today for other, add and depression.  Diagnoses and all orders for this visit:  Binge eating disorder -     lisdexamfetamine (VYVANSE) 30 MG capsule; Take 1 capsule (30 mg total) by mouth daily.  Prolonged posttraumatic stress disorder -     sertraline (ZOLOFT) 100 MG tablet; Take 2 tablets (200 mg total) by mouth daily.  Attention deficit hyperactivity disorder (ADHD), predominantly inattentive type -     lisdexamfetamine (VYVANSE) 30 MG capsule; Take 1 capsule (30 mg total) by mouth daily.  Social anxiety disorder -     sertraline (ZOLOFT) 100 MG tablet; Take 2 tablets (200 mg total) by mouth daily.  Mixed obsessional thoughts and acts -     sertraline (ZOLOFT) 100 MG tablet; Take 2 tablets (200 mg total) by mouth daily.     Please see After Visit Summary for patient specific instructions.  Future Appointments  Date Time Provider [place]  06/16/2019  9:00 AM WL-NM  INJ 1 WL-NM [place]  06/17/2019  8:30 AM Thayer Headings, PMHNP CP-CP None  06/17/2019  9:00 AM WL-NM 1 WL-NM [place]   06/18/2019  9:00 AM WL-NM 1 WL-NM [place]  06/27/2019  9:00 AM WL-NM 1 WL-NM [place]    No orders of the defined types were placed in this encounter.   -------------------------------

## 2019-06-09 DIAGNOSIS — N3946 Mixed incontinence: Secondary | ICD-10-CM | POA: Diagnosis not present

## 2019-06-16 ENCOUNTER — Encounter (HOSPITAL_COMMUNITY): Payer: Self-pay

## 2019-06-16 ENCOUNTER — Ambulatory Visit (HOSPITAL_COMMUNITY): Payer: BC Managed Care – PPO

## 2019-06-17 ENCOUNTER — Ambulatory Visit (INDEPENDENT_AMBULATORY_CARE_PROVIDER_SITE_OTHER): Payer: BC Managed Care – PPO | Admitting: Psychiatry

## 2019-06-17 ENCOUNTER — Encounter (HOSPITAL_COMMUNITY): Payer: BC Managed Care – PPO

## 2019-06-17 ENCOUNTER — Encounter: Payer: Self-pay | Admitting: Psychiatry

## 2019-06-17 DIAGNOSIS — G4700 Insomnia, unspecified: Secondary | ICD-10-CM | POA: Diagnosis not present

## 2019-06-17 DIAGNOSIS — F5081 Binge eating disorder: Secondary | ICD-10-CM | POA: Diagnosis not present

## 2019-06-17 DIAGNOSIS — F900 Attention-deficit hyperactivity disorder, predominantly inattentive type: Secondary | ICD-10-CM

## 2019-06-17 MED ORDER — DAYVIGO 5 MG PO TABS: 5.0000 mg | ORAL_TABLET | Freq: Every day | ORAL | 0 refills | Status: DC

## 2019-06-17 MED ORDER — LISDEXAMFETAMINE DIMESYLATE 30 MG PO CAPS: 30.0000 mg | ORAL_CAPSULE | Freq: Every day | ORAL | 0 refills | Status: DC

## 2019-06-17 NOTE — Progress Notes (Signed)
Tara Ferguson [id number] 12-20-1980 39 y.o.  Virtual Visit via Video Note  I connected with pt @ on 06/17/19 at  8:30 AM EDT by a video enabled telemedicine application and verified that I am speaking with the correct person using two identifiers.   I discussed the limitations of evaluation and management by telemedicine and the availability of in person appointments. The patient expressed understanding and agreed to proceed.  I discussed the assessment and treatment plan with the patient. The patient was provided an opportunity to ask questions and all were answered. The patient agreed with the plan and demonstrated an understanding of the instructions.   The patient was advised to call back or seek an in-person evaluation if the symptoms worsen or if the condition fails to improve as anticipated.  I provided 30 minutes of non-face-to-face time during this encounter.  The patient was located at home.  The provider was located at [place].   Thayer Headings, PMHNP   Subjective:   Patient ID:  Tara Ferguson is a 39 y.o. (DOB 1980/04/26) female.  Chief Complaint:  Chief Complaint  Patient presents with  . Follow-up    ADD, Binge eating d/o, anxiety.     HPI Tara Ferguson presents for follow-up of ADHD, binge eating disorder, and anxiety.  She reports that she has been doing well and Vyvanse seems to be helping some with attention and focus.She has noticed some increased motivation and ability to initiate tasks. She reports that she has noticed some slight decrease in binge eating and reports that it is less severe. Reports that binge eating does not occur daily. Reports that binge eating seems to be sporadic and come in "waves." Had binge eating episode on Wednesday. Reports that she cannot identify triggers or patterns to binge eating, other than possibly being bored and not knowing what to do. Anxiety has been "ok." Has noticed some social anxiety with suspicions that  people may be talking about her when she walks into a room or wondering if she said or did something that offended someone. She reports that her worry overall has been "ok." Occ feels as if her "body is buzzing" and that she has experienced this before starting Vyvanse and it has been more frequent recently. She reports that her mood has been ok with some low level persistent depression. Denies irritability and reports that she has had more patience. She reports that her sleep has been somewhat disrupted over the last week and feels that her "brain won't shut off" and will replay conversations. She reports that her sleep was adequate the first few weeks after starting Vyvanse. Appetite has been ok. Has noticed some slight increase in energy. Denies increased impulsivity. Denies SI.   Reports that she is trying to make a decision about thyroid cancer. Reports thinking about all the possible repercussions of her decisions. Reports that she has been wanting to research options that are credible and balance this without getting overwhelmed.   Past medication trials: Prozac-no improvement Effexor-ineffective Sertraline- Effective for anxiety Wellbutrin-headaches, worsening irritability Latuda-ineffective, nausea,vomiting Vraylar Topamax-taken for headaches Lamictal- Ineffective Lithium Trileptal Trazodone- no side effects BuSpar Concerta- Helpful for Concentration, Motivation, and energy Adhansia   Review of Systems:  Review of Systems  Cardiovascular: Negative for palpitations.  Musculoskeletal: Negative for gait problem.  Neurological: Negative for tremors.  Psychiatric/Behavioral:       Please refer to HPI    Medications: I have reviewed the patient's current medications.  Current Outpatient Medications  Medication  Sig Dispense Refill  . levothyroxine (SYNTHROID) 150 MCG tablet Take 1 tablet (150 mcg total) by mouth as directed. 8 tabs weekly (Patient taking differently: Take 175 mcg  by mouth as directed. 8 tabs weekly) 34 tablet 2  . [START ON 06/18/2019] lisdexamfetamine (VYVANSE) 30 MG capsule Take 1 capsule (30 mg total) by mouth daily. 30 capsule 0  . oxybutynin (DITROPAN-XL) 5 MG 24 hr tablet Take 5 mg by mouth daily.    . sertraline (ZOLOFT) 100 MG tablet Take 2 tablets (200 mg total) by mouth daily. 180 tablet 1  . Lemborexant (DAYVIGO) 5 MG TABS Take 5 mg by mouth at bedtime. Can increase to 10 mg po QHS after 3-5 days if 5 mg is ineffective 10 tablet 0  . [START ON 07/16/2019] lisdexamfetamine (VYVANSE) 30 MG capsule Take 1 capsule (30 mg total) by mouth daily. 30 capsule 0  . [START ON 08/13/2019] lisdexamfetamine (VYVANSE) 30 MG capsule Take 1 capsule (30 mg total) by mouth daily. 30 capsule 0   No current facility-administered medications for this visit.    Medication Side Effects: None  Allergies:  Allergies  Allergen Reactions  . Tape Other (See Comments)    Clear surgical tape causes blisters  . Wellbutrin [Bupropion]     Highly suspicious for causing headaches per pt     Past Medical History:  Diagnosis Date  . Carpal tunnel syndrome, bilateral   . Depression   . Hyperhidrosis   . Migraines   . Multinodular goiter   . Pneumonia    history of  . PONV (postoperative nausea and vomiting)   . Thyroid cancer ([place])    Papillary Thyroid Carcinoma     Family History  Problem Relation Age of Onset  . Depression Father   . Bipolar disorder Father   . Suicidality Father   . ADD / ADHD Father   . Migraines Brother   . ADD / ADHD Brother   . Depression Maternal Grandmother     Social History   Socioeconomic History  . Marital status: Married    Spouse name: Not on file  . Number of children: 2  . Years of education: 42  . Highest education level: Not on file  Occupational History  . Occupation: Nanny  . Occupation: [employer]  . Occupation: Ship broker    Comment: Business  Tobacco Use  . Smoking status: Never Smoker  .  Smokeless tobacco: Never Used  Substance and Sexual Activity  . Alcohol use: No  . Drug use: No  . Sexual activity: Not on file  Other Topics Concern  . Not on file  Social History Narrative   ** Merged History Encounter **       Social Determinants of Health   Financial Resource Strain:   . Difficulty of Paying Living Expenses:   Food Insecurity:   . Worried About Charity fundraiser in the Last Year:   . Arboriculturist in the Last Year:   Transportation Needs:   . Film/video editor (Medical):   [name] Lack of Transportation (Non-Medical):   Physical Activity:   . Days of Exercise per Week:   . Minutes of Exercise per Session:   Stress:   . Feeling of Stress :   Social Connections:   . Frequency of Communication with Friends and Family:   . Frequency of Social Gatherings with Friends and Family:   . Attends Religious Services:   . Active Member of  Clubs or Organizations:   . Attends Archivist Meetings:   [name] Marital Status:   Intimate Partner Violence:   . Fear of Current or Ex-Partner:   . Emotionally Abused:   [name] Physically Abused:   . Sexually Abused:     Past Medical History, Surgical history, Social history, and Family history were reviewed and updated as appropriate.   Please see review of systems for further details on the patient's review from today.   Objective:   Physical Exam:  There were no vitals taken for this visit.  Physical Exam Neurological:     Mental Status: She is alert and oriented to person, place, and time.     Cranial Nerves: No dysarthria.  Psychiatric:        Attention and Perception: Attention and perception normal.        Mood and Affect: Mood is anxious.        Speech: Speech normal.        Behavior: Behavior is cooperative.        Thought Content: Thought content normal. Thought content is not paranoid or delusional. Thought content does not include homicidal or suicidal ideation. Thought content does not include  homicidal or suicidal plan.        Cognition and Memory: Cognition and memory normal.        Judgment: Judgment normal.     Comments: Insight intact     Lab Review:     Component Value Date/Time   NA 136 01/12/2019 1618   NA 144 02/07/2016 0901   K 3.7 01/12/2019 1618   CL 105 01/12/2019 1618   CO2 21 (L) 01/12/2019 1618   GLUCOSE 100 (H) 01/12/2019 1618   BUN 10 01/12/2019 1618   BUN 8 02/07/2016 0901   CREATININE 0.75 01/12/2019 1618   CALCIUM 9.0 01/16/2019 1627   PROT 6.9 02/07/2016 0901   ALBUMIN 4.4 01/16/2019 1627   ALBUMIN 4.5 02/07/2016 0901   AST 24 02/07/2016 0901   ALT 34 (H) 02/07/2016 0901   ALKPHOS 97 02/07/2016 0901   BILITOT 0.3 02/07/2016 0901   GFRNONAA >60 01/12/2019 1618   GFRAA >60 01/12/2019 1618       Component Value Date/Time   WBC 8.9 01/12/2019 1618   RBC 4.46 01/12/2019 1618   HGB 14.1 01/12/2019 1618   HGB 13.2 02/07/2016 0901   HCT 41.4 01/12/2019 1618   HCT 38.9 02/07/2016 0901   PLT 277 01/12/2019 1618   PLT 219 02/07/2016 0901   MCV 92.8 01/12/2019 1618   MCV 93 02/07/2016 0901   MCH 31.6 01/12/2019 1618   MCHC 34.1 01/12/2019 1618   RDW 13.8 01/12/2019 1618   RDW 13.6 02/07/2016 0901    No results found for: POCLITH, LITHIUM   No results found for: PHENYTOIN, PHENOBARB, VALPROATE, CBMZ   .res Assessment: Plan:   Continue current medications at this time and continue to monitor response to start of vyvanse. Discussed that increase in Vyvanse could potentially worsen anxiety if recent slight increase in anxiety is related to Vyvanse. May consider increase in Vyvanse in the future if anxiety improves. Continue Vyvanse 30 mg daily at this time. Discussed potential benefits, risks, and side effects of Dayvigo and patient agrees to trial of Dayvigo. Will provide patient with samples. Discussed starting Dayvigo 5 mg p.o. nightly for 3-5 nights, then increasing to 10 mg at bedtime if 5 mg is ineffective and well-tolerated. Patient to  follow-up in 3 months or sooner if  clinically indicated. Patient advised to contact office with any questions, adverse effects, or acute worsening in signs and symptoms.  Giah was seen today for follow-up.  Diagnoses and all orders for this visit:  Insomnia, unspecified type -     Lemborexant (DAYVIGO) 5 MG TABS; Take 5 mg by mouth at bedtime. Can increase to 10 mg po QHS after 3-5 days if 5 mg is ineffective  Binge eating disorder -     lisdexamfetamine (VYVANSE) 30 MG capsule; Take 1 capsule (30 mg total) by mouth daily. -     lisdexamfetamine (VYVANSE) 30 MG capsule; Take 1 capsule (30 mg total) by mouth daily. -     lisdexamfetamine (VYVANSE) 30 MG capsule; Take 1 capsule (30 mg total) by mouth daily.  Attention deficit hyperactivity disorder (ADHD), predominantly inattentive type -     lisdexamfetamine (VYVANSE) 30 MG capsule; Take 1 capsule (30 mg total) by mouth daily. -     lisdexamfetamine (VYVANSE) 30 MG capsule; Take 1 capsule (30 mg total) by mouth daily. -     lisdexamfetamine (VYVANSE) 30 MG capsule; Take 1 capsule (30 mg total) by mouth daily.     Please see After Visit Summary for patient specific instructions.  Future Appointments  Date Time Provider [place]  06/23/2019  9:30 AM [facility] 1 [facility] [place]  06/24/2019  9:30 AM WL-NM 1 WL-NM [place]  06/25/2019  9:30 AM WL-NM 1 WL-NM [place]  06/27/2019  9:30 AM WL-NM 1 WL-NM [place]    No orders of the defined types were placed in this encounter.     -------------------------------

## 2019-06-18 ENCOUNTER — Ambulatory Visit (HOSPITAL_COMMUNITY): Payer: BC Managed Care – PPO

## 2019-06-20 DIAGNOSIS — E890 Postprocedural hypothyroidism: Secondary | ICD-10-CM | POA: Diagnosis not present

## 2019-06-20 DIAGNOSIS — C73 Malignant neoplasm of thyroid gland: Secondary | ICD-10-CM | POA: Diagnosis not present

## 2019-06-23 ENCOUNTER — Encounter (HOSPITAL_COMMUNITY)
Admission: RE | Admit: 2019-06-23 | Discharge: 2019-06-23 | Disposition: A | Discharge status: Home / Self Care | Payer: BC Managed Care – PPO | Source: Ambulatory Visit | Attending: Endocrinology | Admitting: Endocrinology

## 2019-06-23 ENCOUNTER — Other Ambulatory Visit: Payer: Self-pay

## 2019-06-23 DIAGNOSIS — C73 Malignant neoplasm of thyroid gland: Secondary | ICD-10-CM | POA: Diagnosis not present

## 2019-06-23 MED ORDER — THYROTROPIN ALFA 1.1 MG IM SOLR
INTRAMUSCULAR | Status: AC
  Administered 2019-06-23: 0.9 mg via INTRAMUSCULAR
  Filled 2019-06-23: qty 0.9

## 2019-06-24 ENCOUNTER — Encounter (HOSPITAL_COMMUNITY)
Admission: RE | Admit: 2019-06-24 | Discharge: 2019-06-24 | Disposition: A | Discharge status: Home / Self Care | Payer: BC Managed Care – PPO | Source: Ambulatory Visit | Attending: Endocrinology | Admitting: Endocrinology

## 2019-06-24 DIAGNOSIS — C73 Malignant neoplasm of thyroid gland: Secondary | ICD-10-CM | POA: Diagnosis not present

## 2019-06-24 MED ORDER — THYROTROPIN ALFA 1.1 MG IM SOLR
INTRAMUSCULAR | Status: AC
  Filled 2019-06-24: qty 0.9

## 2019-06-24 MED ORDER — THYROTROPIN ALFA 1.1 MG IM SOLR
0.9000 mg | INTRAMUSCULAR | Status: AC
  Administered 2019-06-24: 0.9 mg via INTRAMUSCULAR

## 2019-06-25 ENCOUNTER — Other Ambulatory Visit: Payer: Self-pay

## 2019-06-25 ENCOUNTER — Encounter (HOSPITAL_COMMUNITY)
Admission: RE | Admit: 2019-06-25 | Discharge: 2019-06-25 | Disposition: A | Discharge status: Home / Self Care | Payer: BC Managed Care – PPO | Source: Ambulatory Visit | Attending: Endocrinology | Admitting: Endocrinology

## 2019-06-25 DIAGNOSIS — C73 Malignant neoplasm of thyroid gland: Secondary | ICD-10-CM | POA: Diagnosis not present

## 2019-06-25 LAB — PREGNANCY, URINE: Preg Test, Ur: NEGATIVE

## 2019-06-25 MED ORDER — SODIUM IODIDE I 131 CAPSULE
4.0400 | Freq: Once | INTRAVENOUS | Status: AC
  Administered 2019-06-25: 4.04 via ORAL

## 2019-06-27 ENCOUNTER — Encounter (HOSPITAL_COMMUNITY)
Admission: RE | Admit: 2019-06-27 | Discharge: 2019-06-27 | Disposition: A | Discharge status: Home / Self Care | Payer: BC Managed Care – PPO | Source: Ambulatory Visit | Attending: Endocrinology | Admitting: Endocrinology

## 2019-06-27 ENCOUNTER — Ambulatory Visit (HOSPITAL_COMMUNITY): Payer: BC Managed Care – PPO

## 2019-06-27 ENCOUNTER — Other Ambulatory Visit: Payer: Self-pay

## 2019-06-27 DIAGNOSIS — C73 Malignant neoplasm of thyroid gland: Secondary | ICD-10-CM | POA: Diagnosis not present

## 2019-06-30 ENCOUNTER — Ambulatory Visit (HOSPITAL_COMMUNITY): Payer: BC Managed Care – PPO

## 2019-07-01 DIAGNOSIS — C73 Malignant neoplasm of thyroid gland: Secondary | ICD-10-CM | POA: Diagnosis not present

## 2019-07-01 DIAGNOSIS — E890 Postprocedural hypothyroidism: Secondary | ICD-10-CM | POA: Diagnosis not present

## 2019-07-07 ENCOUNTER — Other Ambulatory Visit (HOSPITAL_COMMUNITY): Payer: Self-pay | Admitting: Endocrinology

## 2019-07-07 DIAGNOSIS — C73 Malignant neoplasm of thyroid gland: Secondary | ICD-10-CM

## 2019-07-17 ENCOUNTER — Other Ambulatory Visit: Payer: Self-pay | Admitting: Internal Medicine

## 2019-07-28 ENCOUNTER — Encounter (HOSPITAL_COMMUNITY): Payer: BC Managed Care – PPO

## 2019-07-29 ENCOUNTER — Encounter (HOSPITAL_COMMUNITY): Payer: BC Managed Care – PPO

## 2019-07-30 ENCOUNTER — Encounter (HOSPITAL_COMMUNITY): Payer: BC Managed Care – PPO

## 2019-07-31 ENCOUNTER — Other Ambulatory Visit: Payer: Self-pay | Admitting: Internal Medicine

## 2019-08-01 ENCOUNTER — Encounter (HOSPITAL_COMMUNITY): Payer: BC Managed Care – PPO

## 2019-08-12 DIAGNOSIS — E890 Postprocedural hypothyroidism: Secondary | ICD-10-CM | POA: Diagnosis not present

## 2019-08-12 DIAGNOSIS — C73 Malignant neoplasm of thyroid gland: Secondary | ICD-10-CM | POA: Diagnosis not present

## 2019-08-13 DIAGNOSIS — F4310 Post-traumatic stress disorder, unspecified: Secondary | ICD-10-CM | POA: Diagnosis not present

## 2019-08-13 DIAGNOSIS — F3181 Bipolar II disorder: Secondary | ICD-10-CM | POA: Diagnosis not present

## 2019-08-14 DIAGNOSIS — F3181 Bipolar II disorder: Secondary | ICD-10-CM | POA: Diagnosis not present

## 2019-08-14 DIAGNOSIS — F4310 Post-traumatic stress disorder, unspecified: Secondary | ICD-10-CM | POA: Diagnosis not present

## 2019-08-16 DIAGNOSIS — F4310 Post-traumatic stress disorder, unspecified: Secondary | ICD-10-CM | POA: Diagnosis not present

## 2019-08-16 DIAGNOSIS — E7212 Methylenetetrahydrofolate reductase deficiency: Secondary | ICD-10-CM | POA: Diagnosis not present

## 2019-08-16 DIAGNOSIS — F3181 Bipolar II disorder: Secondary | ICD-10-CM | POA: Diagnosis not present

## 2019-08-22 DIAGNOSIS — F3181 Bipolar II disorder: Secondary | ICD-10-CM | POA: Diagnosis not present

## 2019-08-22 DIAGNOSIS — F4310 Post-traumatic stress disorder, unspecified: Secondary | ICD-10-CM | POA: Diagnosis not present

## 2019-09-09 DIAGNOSIS — N3946 Mixed incontinence: Secondary | ICD-10-CM | POA: Diagnosis not present

## 2019-09-10 DIAGNOSIS — J069 Acute upper respiratory infection, unspecified: Secondary | ICD-10-CM | POA: Diagnosis not present

## 2019-09-10 DIAGNOSIS — R05 Cough: Secondary | ICD-10-CM | POA: Diagnosis not present

## 2019-09-10 DIAGNOSIS — R0981 Nasal congestion: Secondary | ICD-10-CM | POA: Diagnosis not present

## 2020-01-06 ENCOUNTER — Ambulatory Visit
Admission: RE | Admit: 2020-01-06 | Discharge: 2020-01-06 | Disposition: A | Discharge status: Home / Self Care | Payer: Self-pay | Source: Ambulatory Visit | Attending: Obstetrics and Gynecology | Admitting: Obstetrics and Gynecology

## 2020-01-06 ENCOUNTER — Other Ambulatory Visit: Payer: Self-pay | Admitting: Obstetrics and Gynecology

## 2020-01-06 DIAGNOSIS — R202 Paresthesia of skin: Secondary | ICD-10-CM

## 2020-01-06 DIAGNOSIS — Z85850 Personal history of malignant neoplasm of thyroid: Secondary | ICD-10-CM | POA: Diagnosis not present

## 2020-01-08 ENCOUNTER — Encounter: Payer: Self-pay | Admitting: Neurology

## 2020-01-08 ENCOUNTER — Other Ambulatory Visit: Payer: Self-pay

## 2020-01-08 ENCOUNTER — Ambulatory Visit (INDEPENDENT_AMBULATORY_CARE_PROVIDER_SITE_OTHER): Payer: BC Managed Care – PPO | Admitting: Neurology

## 2020-01-08 VITALS — BP 127/75 | HR 52 | Ht 69.5 in | Wt 284.0 lb

## 2020-01-08 DIAGNOSIS — H539 Unspecified visual disturbance: Secondary | ICD-10-CM

## 2020-01-08 DIAGNOSIS — R51 Headache with orthostatic component, not elsewhere classified: Secondary | ICD-10-CM

## 2020-01-08 DIAGNOSIS — R200 Anesthesia of skin: Secondary | ICD-10-CM

## 2020-01-08 DIAGNOSIS — R519 Headache, unspecified: Secondary | ICD-10-CM

## 2020-01-08 DIAGNOSIS — G43709 Chronic migraine without aura, not intractable, without status migrainosus: Secondary | ICD-10-CM

## 2020-01-08 DIAGNOSIS — R202 Paresthesia of skin: Secondary | ICD-10-CM

## 2020-01-08 MED ORDER — NURTEC 75 MG PO TBDP
ORAL_TABLET | ORAL | 0 refills | Status: DC
Start: 2020-01-08 — End: 2021-10-27

## 2020-01-08 MED ORDER — TROKENDI XR 100 MG PO CP24: 100.0000 mg | ORAL_CAPSULE | Freq: Every day | ORAL | 6 refills | Status: DC

## 2020-01-08 MED ORDER — SUMATRIPTAN SUCCINATE 100 MG PO TABS: 100.0000 mg | ORAL_TABLET | Freq: Once | ORAL | 12 refills | Status: DC | PRN

## 2020-01-08 NOTE — Progress Notes (Addendum)
[facility]    Provider:  Dr Jaynee Eagles Requesting Provider: Kathyrn Lass, MD Primary Care Provider:  Kathyrn Lass, MD  CC:  Worsening headaches and migraines, hand numbness  HPI:  Tara Ferguson is a 39 y.o. female here as requested by Kathyrn Lass, MD for headaches and hand numbness.  She has a past medical history of chronic migraines depression, headaches, vitamin B12 and vitamin D deficiency, obesity.  She has had headaches for many years, in the past she has had them daily, no inciting events or head trauma or new medication.  They have been worsening over the last year.  She wakes up with a headache sometimes but also developed during the day later in the morning, the headache lasts all day, bending over may make it worsen, laying down does not help, taking Excedrin does not help, they are more in the back of her right side of the neck and her neck hurts, throbbing sensation, pulsating pounding, when she wakes up with a headache she is usually in her back, smoke can trigger, smells can trigger, positive nausea, a dark room helps, brother has migraines, she also has nausea skin tenderness in the occipital area.  No aura.  No medication overuse.  The past that had improved on Trokendi and she was off it for a long time however recently headaches and migraines became worse yes alread since thyroid cancer. Now she is back to where she was initially. She liked the topiramat a lot, she was very happy on the topamax, it helped a lot, it helped with appetite and also with her binge eating.  It helped with the headache and other things. She is having different symptoms though, dizziness, fuzziness, morning headaches, positional and she can wake up and headaches worse when laying down, she has blurred vision. Sudden dizzy spells are new. Also she is worried about her thyroid cancer. She is also having numbness of both arms, worse laying down. Worse when going to sleep. No other focal  neurologic deficits, associated symptoms, inciting events or modifiable factors.  Reviewed notes, labs and imaging from outside physicians, which showed:  Personally reviewed DG cervical spine images and agree: EXAM: CERVICAL SPINE - 2-3 VIEW  COMPARISON:  None.  FINDINGS: There is no evidence of cervical spine fracture or prevertebral soft tissue swelling. Alignment is normal. No other significant bone abnormalities are identified. Multiple surgical clips are noted in the patient's neck likely related to prior thyroidectomy.  IMPRESSION: Negative cervical spine radiographs.  t4 free normal  Review of Systems: Patient complains of symptoms per HPI as well as the following symptoms headache, arm numbness. Pertinent negatives and positives per HPI. All others negative.   Social History   Socioeconomic History  . Marital status: Married    Spouse name: Not on file  . Number of children: 2  . Years of education: 32  . Highest education level: Not on file  Occupational History  . Occupation: Academic librarian auction  . Occupation: Ship broker    Comment: Business  Tobacco Use  . Smoking status: Never Smoker  . Smokeless tobacco: Never Used  Vaping Use  . Vaping Use: Never used  Substance and Sexual Activity  . Alcohol use: No  . Drug use: No  . Sexual activity: Not on file  Other Topics Concern  . Not on file  Social History Narrative   Caffeine - 32 -48 oz sodas daily    Social Determinants of Health   Financial Resource Strain:   .  Difficulty of Paying Living Expenses: Not on file  Food Insecurity:   . Worried About Charity fundraiser in the Last Year: Not on file  . Ran Out of Food in the Last Year: Not on file  Transportation Needs:   . Lack of Transportation (Medical): Not on file  . Lack of Transportation (Non-Medical): Not on file  Physical Activity:   . Days of Exercise per Week: Not on file  . Minutes of Exercise per Session: Not on file  Stress:   . Feeling of  Stress : Not on file  Social Connections:   . Frequency of Communication with Friends and Family: Not on file  . Frequency of Social Gatherings with Friends and Family: Not on file  . Attends Religious Services: Not on file  . Active Member of Clubs or Organizations: Not on file  . Attends Archivist Meetings: Not on file  . Marital Status: Not on file  Intimate Partner Violence:   . Fear of Current or Ex-Partner: Not on file  . Emotionally Abused: Not on file  . Physically Abused: Not on file  . Sexually Abused: Not on file    Family History  Problem Relation Age of Onset  . Depression Father   . Bipolar disorder Father   . Suicidality Father   . ADD / ADHD Father   . Migraines Brother   . ADD / ADHD Brother   . Depression Maternal Grandmother     Past Medical History:  Diagnosis Date  . Carpal tunnel syndrome, bilateral   . Depression   . Hyperhidrosis   . Migraines   . Multinodular goiter   . Pneumonia    history of  . PONV (postoperative nausea and vomiting)   . Thyroid cancer ([place])    Papillary Thyroid Carcinoma     Patient Active Problem List   Diagnosis Date Noted  . Anxiety disorder 01/19/2019  . Vitamin D deficiency 01/19/2019  . Postoperative hypothyroidism 01/16/2019  . 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 Multinodular goiter 12/11/2018  . Prolonged posttraumatic stress disorder 01/09/2018  . Chronic migraine without aura without status migrainosus, not intractable 02/07/2016  . Depression 02/07/2016    Past Surgical History:  Procedure Laterality Date  . BACK SURGERY    . SYMPATHECTOMY    . THYROIDECTOMY N/A 12/24/2018   Procedure: TOTAL THYROIDECTOMY;  Surgeon: Armandina Gemma, MD;  Location: [facility];  Service: General;  Laterality: N/A;    Current Outpatient Medications  Medication Sig Dispense Refill  . EUTHYROX 137 MCG tablet Take by mouth.    . oxybutynin (DITROPAN-XL)  5 MG 24 hr tablet Take 5 mg by mouth daily.    . Rimegepant Sulfate (NURTEC) 75 MG TBDP Take one tablet daily for 4-6 days or daily as needed 8 tablet 0  . SUMAtriptan (IMITREX) 100 MG tablet Take 1 tablet (100 mg total) by mouth once as needed for up to 1 dose. May repeat in 2 hours if headache persists or recurs. 10 tablet 12  . Topiramate ER (TROKENDI XR) 100 MG CP24 Take 100 mg by mouth at bedtime. 30 capsule 6   No current facility-administered medications for this visit.    Allergies as of 01/08/2020 - Review Complete 01/08/2020  Allergen Reaction Noted  . Tape Other (See Comments) 12/20/2018  . Wellbutrin [bupropion]  08/15/2017    Vitals: BP 127/75   Pulse (!) 52   Ht 5' 9.5" (  1.765 m)   Wt 284 lb (128.8 kg)   BMI 41.34 kg/m  Last Weight:  Wt Readings from Last 1 Encounters:  01/08/20 284 lb (128.8 kg)   Last Height:   Ht Readings from Last 1 Encounters:  01/08/20 5' 9.5" (1.765 m)     Physical exam: Exam: Gen: NAD, conversant, well nourised, obese, well groomed                     CV: RRR, no MRG. No Carotid Bruits. No peripheral edema, warm, nontender Eyes: Conjunctivae clear without exudates or hemorrhage  Neuro: Detailed Neurologic Exam  Speech:    Speech is normal; fluent and spontaneous with normal comprehension.  Cognition:    The patient is oriented to person, place, and time;     recent and remote memory intact;     language fluent;     normal attention, concentration,     fund of knowledge Cranial Nerves:    The pupils are equal, round, and reactive to light. The fundi are normal and spontaneous venous pulsations are present. Visual fields are full to finger confrontation. Extraocular movements are intact. Trigeminal sensation is intact and the muscles of mastication are normal. The face is symmetric. The palate elevates in the midline. Hearing intact. Voice is normal. Shoulder shrug is normal. The tongue has normal motion without fasciculations.    Coordination:    Normal finger to nose and heel to shin. Normal rapid alternating movements.   Gait:    Heel-toe and tandem gait are normal.   Motor Observation:    No asymmetry, no atrophy, and no involuntary movements noted. Tone:    Normal muscle tone.    Posture:    Posture is normal. normal erect    Strength:    Strength is V/V in the upper and lower limbs.      Sensation: intact to LT     Reflex Exam:  DTR's:    Deep tendon reflexes in the upper and lower extremities are normal bilaterally.   Toes:    The toes are downgoing bilaterally.   Clonus:    Clonus is absent.    Assessment/Plan:      Assessment/Plan:  39 year old with chronic migraines without aura not intractable without status migrainosus. Also arm numbness evaluate for CTS (+McPhalen's maneuver) with emg/ncs  Addendum 02/09/2020: emg/ncs, for arm numbness and upper arm soreness/fatigue, normal. We discussed MRI of the cervical spine, at this time she declines. Follow clinically.   Sumatriptan: Please take one tablet at the onset of your headache. If it does not improve the symptoms please take one additional tablet. Do not take more then 2 tablets in 24hrs. Do not take use more then 2 to 3 times in a week.  Nurtec: Once daily as needed  Trokendi: Take 50mg  at bedtime for 4 days, then take 75mg  at bedtime for 4 days then take 100mg  at bedtime (gave samples)  Over the next 2-3 days take sumatriptan twice daily and nurtec once daily to try and break migraine  MRI of the brain: MRI brain due to concerning symptoms of morning headaches, positional headaches,vision changes  to look for space occupying mass, chiari or intracranial hypertension (pseudotumor).  Emg/ncs: bilateral uppers  Sleep evaluation: was negative  Discussed: To prevent or relieve headaches, try the following: Cool Compress. Lie down and place a cool compress on your head.  Avoid headache triggers. If certain foods or odors seem  to have triggered  your migraines in the past, avoid them. A headache diary might help you identify triggers.  Include physical activity in your daily routine. Try a daily walk or other moderate aerobic exercise.  Manage stress. Find healthy ways to cope with the stressors, such as delegating tasks on your to-do list.  Practice relaxation techniques. Try deep breathing, yoga, massage and visualization.  Eat regularly. Eating regularly scheduled meals and maintaining a healthy diet might help prevent headaches. Also, drink plenty of fluids.  Follow a regular sleep schedule. Sleep deprivation might contribute to headaches Consider biofeedback. With this mind-body technique, you learn to control certain bodily functions -- such as muscle tension, heart rate and blood pressure -- to prevent headaches or reduce headache pain.    Proceed to emergency room if you experience new or worsening symptoms or symptoms do not resolve, if you have new neurologic symptoms or if headache is severe, or for any concerning symptom.   Provided education and documentation from American headache Society toolbox including articles on: chronic migraine medication overuse headache, chronic migraines, prevention of migraines, behavioral and other nonpharmacologic treatments for headache.   Orders Placed This Encounter  Procedures  . MR BRAIN W WO CONTRAST  . CBC with Differential/Platelets  . Comprehensive metabolic panel  . NCV with EMG(electromyography)   Meds ordered this encounter  Medications  . SUMAtriptan (IMITREX) 100 MG tablet    Sig: Take 1 tablet (100 mg total) by mouth once as needed for up to 1 dose. May repeat in 2 hours if headache persists or recurs.    Dispense:  10 tablet    Refill:  12  . Rimegepant Sulfate (NURTEC) 75 MG TBDP    Sig: Take one tablet daily for 4-6 days or daily as needed    Dispense:  8 tablet    Refill:  0  . Topiramate ER (TROKENDI XR) 100 MG CP24    Sig: Take 100 mg by mouth  at bedtime.    Dispense:  30 capsule    Refill:  6    Patient has copay card; she can have medication regardless of insurance approval or copay amount.    Cc: Kathyrn Lass, MD,  Kathyrn Lass, MD  Sarina Ill, MD  [facility] [address] [place], [place] [address]  Phone [phone] Fax [phone]

## 2020-01-08 NOTE — Patient Instructions (Addendum)
Sumatriptan: Please take one tablet at the onset of your headache. If it does not improve the symptoms please take one additional tablet. Do not take more then 2 tablets in 24hrs. Do not take use more then 2 to 3 times in a week.  Nurtec: Once daily as needed  Trokendi: Take 50mg  at bedtime for 4 days, then take 75mg  at bedtime for 4 days then take 100mg  at bedtime  Over the next 2-3 days take sumatriptan twice daily and nurtec once daily to try and break migraine  MRI of the brain Emg/ncs    Electromyoneurogram Electromyoneurogram is a test to check how well your muscles and nerves are working. This procedure includes the combined use of electromyogram (EMG) and nerve conduction study (NCS). EMG is used to look for muscular disorders. NCS, which is also called electroneurogram, measures how well your nerves are controlling your muscles. The procedures are usually done together to check if your muscles and nerves are healthy. If the results of the tests are abnormal, this may indicate disease or injury, such as a neuromuscular disease or peripheral nerve damage. Tell a health care provider about:  Any allergies you have.  All medicines you are taking, including vitamins, herbs, eye drops, creams, and over-the-counter medicines.  Any problems you or family members have had with anesthetic medicines.  Any blood disorders you have.  Any surgeries you have had.  Any medical conditions you have.  If you have a pacemaker.  Whether you are pregnant or may be pregnant. What are the risks? Generally, this is a safe procedure. However, problems may occur, including:  Infection where the electrodes were inserted.  Bleeding. What happens before the procedure? Medicines Ask your health care provider about:  Changing or stopping your regular medicines. This is especially important if you are taking diabetes medicines or blood thinners.  Taking medicines such as aspirin and ibuprofen.  These medicines can thin your blood. Do not take these medicines unless your health care provider tells you to take them.  Taking over-the-counter medicines, vitamins, herbs, and supplements. General instructions  Your health care provider may ask you to avoid: ? Beverages that have caffeine, such as coffee and tea. ? Any products that contain nicotine or tobacco. These products include cigarettes, e-cigarettes, and chewing tobacco. If you need help quitting, ask your health care provider.  Do not use lotions or creams on the same day that you will be having the procedure. What happens during the procedure? For EMG   Your health care provider will ask you to stay in a position so that he or she can access the muscle that will be studied. You may be standing, sitting, or lying down.  You may be given a medicine that numbs the area (local anesthetic).  A very thin needle that has an electrode will be inserted into your muscle.  Another small electrode will be placed on your skin near the muscle.  Your health care provider will ask you to continue to remain still.  The electrodes will send a signal that tells about the electrical activity of your muscles. You may see this on a monitor or hear it in the room.  After your muscles have been studied at rest, your health care provider will ask you to contract or flex your muscles. The electrodes will send a signal that tells about the electrical activity of your muscles.  Your health care provider will remove the electrodes and the electrode needles when the procedure  is finished. The procedure may vary among health care providers and hospitals. For NCS   An electrode that records your nerve activity (recording electrode) will be placed on your skin by the muscle that is being studied.  An electrode that is used as a reference (reference electrode) will be placed near the recording electrode.  A paste or gel will be applied to your skin  between the recording electrode and the reference electrode.  Your nerve will be stimulated with a mild shock. Your health care provider will measure how much time it takes for your muscle to react.  Your health care provider will remove the electrodes and the gel when the procedure is finished. The procedure may vary among health care providers and hospitals. What happens after the procedure?  It is up to you to get the results of your procedure. Ask your health care provider, or the department that is doing the procedure, when your results will be ready.  Your health care provider may: ? Give you medicines for any pain. ? Monitor the insertion sites to make sure that bleeding stops. Summary  Electromyoneurogram is a test to check how well your muscles and nerves are working.  If the results of the tests are abnormal, this may indicate disease or injury.  This is a safe procedure. However, problems may occur, such as bleeding and infection.  Your health care provider will do two tests to complete this procedure. One checks your muscles (EMG) and another checks your nerves (NCS).  It is up to you to get the results of your procedure. Ask your health care provider, or the department that is doing the procedure, when your results will be ready. This information is not intended to replace advice given to you by your health care provider. Make sure you discuss any questions you have with your health care provider. Document Revised: 11/13/2017 Document Reviewed: 10/26/2017 Elsevier Patient Education  [place].    Topiramate extended-release capsules What is this medicine? TOPIRAMATE (toe PYRE a mate) is used to treat seizures in adults or children with epilepsy. It is also used for the prevention of migraine headaches. This medicine may be used for other purposes; ask your health care provider or pharmacist if you have questions. COMMON BRAND NAME(S): Trokendi XR What should I  tell my health care provider before I take this medicine? They need to know if you have any of these conditions:  bleeding disorders  if you often drink alcohol  kidney disease  lung or breathing disease, like asthma  suicidal thoughts, plans, or attempt; a previous suicide attempt by you or a family member  an unusual or allergic reaction to topiramate, other medicines, foods, dyes, or preservatives  pregnant or trying to get pregnant  breast-feeding How should I use this medicine? Take this medicine by mouth with a glass of water. Follow the directions on the prescription label. Do not cut, crush or chew this medicine. Swallow the capsules whole. You can take it with or without food. If it upsets your stomach, take it with food. Take your medicine at regular intervals. Do not take it more often than directed. Do not stop taking except on your doctor's advice. A special MedGuide will be given to you by the pharmacist with each prescription and refill. Be sure to read this information carefully each time. Talk to your pediatrician regarding the use of this medicine in children. While this drug may be prescribed for children as young as  6 years for selected conditions, precautions do apply. Overdosage: If you think you have taken too much of this medicine contact a poison control center or emergency room at once. NOTE: This medicine is only for you. Do not share this medicine with others. What if I miss a dose? If you miss a dose, take it as soon as you can. If it is almost time for your next dose, take only that dose. Do not take double or extra doses. What may interact with this medicine? This medicine may interact with the following medications:  acetazolamide  alcohol  antihistamines for allergy, cough, and cold  aspirin and aspirin-like medicines  atropine  birth control pills  certain medicines for anxiety or sleep  certain medicines for bladder problems like  oxybutynin, tolterodine  certain medicines for depression like amitriptyline, fluoxetine, sertraline  certain medicines for seizures like carbamazepine, phenobarbital, phenytoin, primidone, valproic acid, zonisamide  certain medicines for stomach problems like dicyclomine, hyoscyamine  certain medicines for travel sickness like scopolamine  certain medicines for Parkinson's disease like benztropine, trihexyphenidyl  certain medicines that treat or prevent blood clots like warfarin, enoxaparin, dalteparin, apixaban, dabigatran, and rivaroxaban  digoxin  general anesthetics like halothane, isoflurane, methoxyflurane, propofol  hydrochlorothiazide  ipratropium  lithium  medicines that relax muscles for surgery  metformin  narcotic medicines for pain  NSAIDs, medicines for pain and inflammation, like ibuprofen or naproxen  phenothiazines like chlorpromazine, mesoridazine, prochlorperazine, thioridazine  pioglitazone This list may not describe all possible interactions. Give your health care provider a list of all the medicines, herbs, non-prescription drugs, or dietary supplements you use. Also tell them if you smoke, drink alcohol, or use illegal drugs. Some items may interact with your medicine. What should I watch for while using this medicine? Visit your doctor or health care professional for regular checks on your progress. Tell your health care professional if your symptoms do not start to get better or if they get worse. Do not stop taking except on your health care professional's advice. You may develop a severe reaction. Your health care professional will tell you how much medicine to take. Wear a medical ID bracelet or chain. Carry a card that describes your disease and details of your medicine and dosage times. This medicine can reduce the response of your body to heat or cold. Dress warm in cold weather and stay hydrated in hot weather. If possible, avoid extreme  temperatures like saunas, hot tubs, very hot or cold showers, or activities that can cause dehydration such as vigorous exercise. Check with your health care professional if you have severe diarrhea, nausea, and vomiting, or if you sweat a lot. The loss of too much body fluid may make it dangerous for you to take this medicine. You may get drowsy or dizzy. Do not drive, use machinery, or do anything that needs mental alertness until you know how this medicine affects you. Do not stand up or sit up quickly, especially if you are an older patient. This reduces the risk of dizzy or fainting spells. Alcohol may interfere with the effect of this medicine. Avoid alcoholic drinks. Tell your health care professional right away if you have any change in your eyesight. Patients and their families should watch out for new or worsening depression or thoughts of suicide. Also watch out for sudden changes in feelings such as feeling anxious, agitated, panicky, irritable, hostile, aggressive, impulsive, severely restless, overly excited and hyperactive, or not being able to sleep. If this happens,  especially at the beginning of treatment or after a change in dose, call your healthcare professional. This medicine may cause serious skin reactions. They can happen weeks to months after starting the medicine. Contact your health care provider right away if you notice fevers or flu-like symptoms with a rash. The rash may be red or purple and then turn into blisters or peeling of the skin. Or, you might notice a red rash with swelling of the face, lips or lymph nodes in your neck or under your arms. Birth control may not work properly while you are taking this medicine. Talk to your health care professional about using an extra method of birth control. Women should inform their health care professional if they wish to become pregnant or think they might be pregnant. There is a potential for serious side effects and harm to an  unborn child. Talk to your health care professional for more information. What side effects may I notice from receiving this medicine? Side effects that you should report to your doctor or health care professional as soon as possible:  allergic reactions like skin rash, itching or hives, swelling of the face, lips, or tongue  blood in the urine  changes in vision  confusion  loss of memory  pain in lower back or side  pain when urinating  redness, blistering, peeling or loosening of the skin, including inside the mouth  signs and symptoms of bleeding such as bloody or black, tarry stools; red or dark brown urine; spitting up blood or brown material that looks like coffee grounds; red spots on the skin; unusual bruising or bleeding from the eyes, gums, or nose  signs and symptoms of increased acid in the body like breathing fast; fast heartbeat; headache; confusion; unusually weak or tired; nausea, vomiting  suicidal thoughts, mood changes  trouble speaking or understanding  unusual sweating  unusually weak or tired Side effects that usually do not require medical attention (report to your doctor or health care professional if they continue or are bothersome):  dizziness  drowsiness  fever  loss of appetite  nausea, vomiting  pain, tingling, numbness in the hands or feet  stomach pain  tiredness  upset stomach This list may not describe all possible side effects. Call your doctor for medical advice about side effects. You may report side effects to FDA at 1-800-FDA-1088. Where should I keep my medicine? Keep out of the reach of children. Store at room temperature between 15 and 30 degrees C (59 and 86 degrees F). Throw away any unused medicine after the expiration date. NOTE: This sheet is a summary. It may not cover all possible information. If you have questions about this medicine, talk to your doctor, pharmacist, or health care provider.  2020 Elsevier/Gold  Standard (2018-09-26 15:34:16) Rimegepant oral dissolving tablet What is this medicine? RIMEGEPANT (ri ME je pant) is used to treat migraine headaches with or without aura. An aura is a strange feeling or visual disturbance that warns you of an attack. It is not used to prevent migraines. This medicine may be used for other purposes; ask your health care provider or pharmacist if you have questions. COMMON BRAND NAME(S): NURTEC ODT What should I tell my health care provider before I take this medicine? They need to know if you have any of these conditions:  kidney disease  liver disease  an unusual or allergic reaction to rimegepant, other medicines, foods, dyes, or preservatives  pregnant or trying to get pregnant  breast-feeding How should I use this medicine? Take the medicine by mouth. Follow the directions on the prescription label. Leave the tablet in the sealed blister pack until you are ready to take it. With dry hands, open the blister and gently remove the tablet. If the tablet breaks or crumbles, throw it away and take a new tablet out of the blister pack. Place the tablet in the mouth and allow it to dissolve, and then swallow. Do not cut, crush, or chew this medicine. You do not need water to take this medicine. Talk to your pediatrician about the use of this medicine in children. Special care may be needed. Overdosage: If you think you have taken too much of this medicine contact a poison control center or emergency room at once. NOTE: This medicine is only for you. Do not share this medicine with others. What if I miss a dose? This does not apply. This medicine is not for regular use. What may interact with this medicine? This medicine may interact with the following medications:  certain medicines for fungal infections like fluconazole, itraconazole  rifampin This list may not describe all possible interactions. Give your health care provider a list of all the  medicines, herbs, non-prescription drugs, or dietary supplements you use. Also tell them if you smoke, drink alcohol, or use illegal drugs. Some items may interact with your medicine. What should I watch for while using this medicine? Visit your health care professional for regular checks on your progress. Tell your health care professional if your symptoms do not start to get better or if they get worse. What side effects may I notice from receiving this medicine? Side effects that you should report to your doctor or health care professional as soon as possible:  allergic reactions like skin rash, itching or hives; swelling of the face, lips, or tongue Side effects that usually do not require medical attention (report these to your doctor or health care professional if they continue or are bothersome):  nausea This list may not describe all possible side effects. Call your doctor for medical advice about side effects. You may report side effects to FDA at 1-800-FDA-1088. Where should I keep my medicine? Keep out of the reach of children. Store at room temperature between 15 and 30 degrees C (59 and 86 degrees F). Throw away any unused medicine after the expiration date. NOTE: This sheet is a summary. It may not cover all possible information. If you have questions about this medicine, talk to your doctor, pharmacist, or health care provider.  2020 Elsevier/Gold Standard (2018-05-13 00:21:31) Sumatriptan tablets What is this medicine? SUMATRIPTAN (soo ma TRIP tan) is used to treat migraines with or without aura. An aura is a strange feeling or visual disturbance that warns you of an attack. It is not used to prevent migraines. This medicine may be used for other purposes; ask your health care provider or pharmacist if you have questions. COMMON BRAND NAME(S): Imitrex, Migraine Pack What should I tell my health care provider before I take this medicine? They need to know if you have any of  these conditions:  cigarette smoker  circulation problems in fingers and toes  diabetes  heart disease  high blood pressure  high cholesterol  history of irregular heartbeat  history of stroke  kidney disease  liver disease  stomach or intestine problems  an unusual or allergic reaction to sumatriptan, other medicines, foods, dyes, or preservatives  pregnant or trying to get pregnant  breast-feeding How should I use this medicine? Take this medicine by mouth with a glass of water. Follow the directions on the prescription label. Do not take it more often than directed. Talk to your pediatrician regarding the use of this medicine in children. Special care may be needed. Overdosage: If you think you have taken too much of this medicine contact a poison control center or emergency room at once. NOTE: This medicine is only for you. Do not share this medicine with others. What if I miss a dose? This does not apply. This medicine is not for regular use. What may interact with this medicine? Do not take this medicine with any of the following medicines:  certain medicines for migraine headache like almotriptan, eletriptan, frovatriptan, naratriptan, rizatriptan, sumatriptan, zolmitriptan  ergot alkaloids like dihydroergotamine, ergonovine, ergotamine, methylergonovine  MAOIs like Carbex, Eldepryl, Marplan, Nardil, and Parnate This medicine may also interact with the following medications:  certain medicines for depression, anxiety, or psychotic disorders This list may not describe all possible interactions. Give your health care provider a list of all the medicines, herbs, non-prescription drugs, or dietary supplements you use. Also tell them if you smoke, drink alcohol, or use illegal drugs. Some items may interact with your medicine. What should I watch for while using this medicine? Visit your healthcare professional for regular checks on your progress. Tell your  healthcare professional if your symptoms do not start to get better or if they get worse. You may get drowsy or dizzy. Do not drive, use machinery, or do anything that needs mental alertness until you know how this medicine affects you. Do not stand up or sit up quickly, especially if you are an older patient. This reduces the risk of dizzy or fainting spells. Alcohol may interfere with the effect of this medicine. Tell your healthcare professional right away if you have any change in your eyesight. If you take migraine medicines for 10 or more days a month, your migraines may get worse. Keep a diary of headache days and medicine use. Contact your healthcare professional if your migraine attacks occur more frequently. What side effects may I notice from receiving this medicine? Side effects that you should report to your doctor or health care professional as soon as possible:  allergic reactions like skin rash, itching or hives, swelling of the face, lips, or tongue  changes in vision  chest pain or chest tightness  signs and symptoms of a dangerous change in heartbeat or heart rhythm like chest pain; dizziness; fast, irregular heartbeat; palpitations; feeling faint or lightheaded; falls; breathing problems  signs and symptoms of a stroke like changes in vision; confusion; trouble speaking or understanding; severe headaches; sudden numbness or weakness of the face, arm or leg; trouble walking; dizziness; loss of balance or coordination  signs and symptoms of serotonin syndrome like irritable; confusion; diarrhea; fast or irregular heartbeat; muscle twitching; stiff muscles; trouble walking; sweating; high fever; seizures; chills; vomiting Side effects that usually do not require medical attention (report to your doctor or health care professional if they continue or are bothersome):  diarrhea  dizziness  drowsiness  dry mouth  headache  nausea, vomiting  pain, tingling, numbness in  the hands or feet  stomach pain This list may not describe all possible side effects. Call your doctor for medical advice about side effects. You may report side effects to FDA at 1-800-FDA-1088. Where should I keep my medicine? Keep out of the reach of children. Store at room temperature  between 2 and 30 degrees C (36 and 86 degrees F). Throw away any unused medicine after the expiration date. NOTE: This sheet is a summary. It may not cover all possible information. If you have questions about this medicine, talk to your doctor, pharmacist, or health care provider.  2020 Elsevier/Gold Standard (2017-09-11 15:05:37)

## 2020-01-12 ENCOUNTER — Encounter: Payer: Self-pay | Admitting: Neurology

## 2020-01-13 ENCOUNTER — Telehealth: Payer: Self-pay | Admitting: *Deleted

## 2020-01-13 ENCOUNTER — Telehealth: Payer: Self-pay | Admitting: Neurology

## 2020-01-13 NOTE — Telephone Encounter (Signed)
no to the covid questions MR Brain w/wo contrast Dr. Ihor Dow Auth: Coleman spoke to [facility] Ref # [id number]. Patient is scheduled at [facility] for 01/27/20.

## 2020-01-13 NOTE — Telephone Encounter (Signed)
Trokendi XR 100 mg PA completed on Cover My Meds. KeyElisabeth Ferguson - Rx #: [id number]. Awaiting determination from [place].

## 2020-01-13 NOTE — Telephone Encounter (Signed)
Approved today  Effective from 01/13/2020 through 01/11/2023.

## 2020-01-19 ENCOUNTER — Other Ambulatory Visit: Payer: Self-pay

## 2020-01-27 ENCOUNTER — Ambulatory Visit: Payer: BC Managed Care – PPO

## 2020-01-27 DIAGNOSIS — R200 Anesthesia of skin: Secondary | ICD-10-CM

## 2020-01-27 DIAGNOSIS — R51 Headache with orthostatic component, not elsewhere classified: Secondary | ICD-10-CM | POA: Diagnosis not present

## 2020-01-27 DIAGNOSIS — R519 Headache, unspecified: Secondary | ICD-10-CM | POA: Diagnosis not present

## 2020-01-27 DIAGNOSIS — R202 Paresthesia of skin: Secondary | ICD-10-CM

## 2020-01-27 DIAGNOSIS — H539 Unspecified visual disturbance: Secondary | ICD-10-CM | POA: Diagnosis not present

## 2020-01-27 MED ORDER — GADOBENATE DIMEGLUMINE 529 MG/ML IV SOLN
20.0000 mL | Freq: Once | INTRAVENOUS | Status: AC | PRN
  Administered 2020-01-27: 20 mL via INTRAVENOUS

## 2020-01-29 DIAGNOSIS — J069 Acute upper respiratory infection, unspecified: Secondary | ICD-10-CM | POA: Diagnosis not present

## 2020-02-08 NOTE — Progress Notes (Signed)
Full Name: Tara Ferguson Gender: Female MRN #: [id number] Date of Birth: May 05, 1980    Visit Date: 02/09/2020 13:41 Age: 39 Years Examining Physician: Sarina Ill, MD  Referring Physician: Sarina Ill, MD, Kathyrn Lass MD Height: 5 feet 9 inch Patient weight: 284lbs    Patient History: Patient with numbness in the arms, worse when going to sleep, arm numbness, feeling of heaviness, fatigue and muscle pain in the upper arms, numbness of skin bilaterally. Here for EMG nerve conduction study of the bilateral uppers to evaluate for mononeuropathy versus polyneuropathy or cervical radiculopathy. We discussed ordering MRI of the cervical spine, at this time she declines. Follow clinically.  Summary: EMG/NCS was performed on the bilateral upper extremities. All nerves and muscles (as indicated in the following tables) were within normal limits.    Conclusion: This is a normal study. No electrophysiologic evidence for carpal tunnel syndrome or other mononeuropathy, polyneuropathy or cervical radiculopathy. MRI of the cervical spine recommended as clinically warranted.    Sarina Ill M.D.  [facility] [address], [place], [place] [address] Tel: [phone] Fax: [phone]  Verbal informed consent was obtained from the patient, patient was informed of potential risk of procedure, including bruising, bleeding, hematoma formation, infection, muscle weakness, muscle pain, numbness, among others.        [place]    Nerve / Sites Muscle Latency Ref. Amplitude Ref. Rel Amp Segments Distance Velocity Ref. Area    ms ms mV mV %  cm m/s m/s mVms  L Median - APB     Wrist APB 3.4 ?4.4 7.6 ?4.0 100 Wrist - APB 7   29.5     Upper arm APB 6.8  7.7  101 Upper arm - Wrist 21 61 ?49 29.4  R Median - APB     Wrist APB 3.0 ?4.4 9.0 ?4.0 100 Wrist - APB 7   36.3     Upper arm APB 6.4  8.8  96.9 Upper arm - Wrist 22 64 ?49 34.8  L Ulnar - ADM     Wrist ADM 2.0 ?3.3  10.4 ?6.0 100 Wrist - ADM 7   43.4     B.Elbow ADM 4.9  10.7  103 B.Elbow - Wrist 19 65 ?49 43.0     A.Elbow ADM 6.6  11.0  104 A.Elbow - B.Elbow 10 61 ?49 50.8  R Ulnar - ADM     Wrist ADM 2.3 ?3.3 9.7 ?6.0 100 Wrist - ADM 7   43.0     B.Elbow ADM 5.2  9.2  95.2 B.Elbow - Wrist 19 64 ?49 41.9     A.Elbow ADM 6.8  9.4  102 A.Elbow - B.Elbow 10 64 ?49 41.3             SNC    Nerve / Sites Rec. Site Peak Lat Ref.  Amp Ref. Segments Distance Peak Diff Ref.    ms ms V V  cm ms ms  L Median, Ulnar - Transcarpal comparison     Median Palm Wrist 1.8 ?2.2 98 ?35 Median Palm - Wrist 8       Ulnar Palm Wrist 1.8 ?2.2 46 ?12 Ulnar Palm - Wrist 8          Median Palm - Ulnar Palm  0.0 ?0.4  R Median, Ulnar - Transcarpal comparison     Median Palm Wrist 1.8 ?2.2 128 ?35 Median Palm - Wrist 8       Ulnar  Palm Wrist 1.7 ?2.2 24 ?12 Ulnar Palm - Wrist 8          Median Palm - Ulnar Palm  0.1 ?0.4  L Median - Orthodromic (Dig II, Mid palm)     Dig II Wrist 2.6 ?3.4 20 ?10 Dig II - Wrist 13    R Median - Orthodromic (Dig II, Mid palm)     Dig II Wrist 2.7 ?3.4 15 ?10 Dig II - Wrist 13    L Ulnar - Orthodromic, (Dig V, Mid palm)     Dig V Wrist 2.3 ?3.1 15 ?5 Dig V - Wrist 11    R Ulnar - Orthodromic, (Dig V, Mid palm)     Dig V Wrist 2.2 ?3.1 8 ?5 Dig V - Wrist 39                   F  Wave    Nerve F Lat Ref.   ms ms  L Ulnar - ADM 26.0 ?32.0  R Ulnar - ADM 26.1 ?32.0         EMG Summary Table    Spontaneous MUAP Recruitment  Muscle IA Fib PSW Fasc Other Amp Dur. Poly Pattern  R Cervical paraspinals (low) Normal None None None _______ Normal Normal Normal Normal  R. Deltoid Normal None None None _______ Normal Normal Normal Normal  R. Triceps brachii Normal None None None _______ Normal Normal Normal Normal  R. Biceps brachii Normal None None None _______ Normal Normal Normal Normal  R Pronator teres Normal None None None _______ Normal Normal Normal Normal  R. First dorsal interosseous  Normal None None None _______ Normal Normal Normal Normal  R Opponens pollicis Normal None None None _______ Normal Normal Normal Normal

## 2020-02-09 ENCOUNTER — Ambulatory Visit (INDEPENDENT_AMBULATORY_CARE_PROVIDER_SITE_OTHER): Payer: BC Managed Care – PPO | Admitting: Neurology

## 2020-02-09 ENCOUNTER — Other Ambulatory Visit: Payer: Self-pay

## 2020-02-09 DIAGNOSIS — R200 Anesthesia of skin: Secondary | ICD-10-CM

## 2020-02-09 DIAGNOSIS — R202 Paresthesia of skin: Secondary | ICD-10-CM

## 2020-02-09 DIAGNOSIS — M79601 Pain in right arm: Secondary | ICD-10-CM

## 2020-02-09 DIAGNOSIS — Z0289 Encounter for other administrative examinations: Secondary | ICD-10-CM

## 2020-02-09 NOTE — Progress Notes (Signed)
See procedure note.

## 2020-02-09 NOTE — Procedures (Signed)
Full Name: Tara Ferguson Gender: Female MRN #: [id number] Date of Birth: 02-19-1981    Visit Date: 02/09/2020 13:41 Age: 39 Years Examining Physician: Sarina Ill, MD  Referring Physician: Sarina Ill, MD, Kathyrn Lass MD Height: 5 feet 9 inch Patient weight: 284lbs    Patient History: Patient with numbness in the arms, worse when going to sleep, arm numbness, feeling of heaviness, fatigue and muscle pain in the upper arms, numbness of skin bilaterally. Here for EMG nerve conduction study of the bilateral uppers to evaluate for mononeuropathy versus polyneuropathy or cervical radiculopathy. We discussed ordering MRI of the cervical spine, at this time she declines. Follow clinically.  Summary: EMG/NCS was performed on the bilateral upper extremities. All nerves and muscles (as indicated in the following tables) were within normal limits.    Conclusion: This is a normal study. No electrophysiologic evidence for carpal tunnel syndrome or other mononeuropathy, polyneuropathy or cervical radiculopathy. MRI of the cervical spine recommended as clinically warranted.    Sarina Ill M.D.  [facility] [address] [place], [place] [address] Tel: [phone] Fax: [phone]  Verbal informed consent was obtained from the patient, patient was informed of potential risk of procedure, including bruising, bleeding, hematoma formation, infection, muscle weakness, muscle pain, numbness, among others.        [place]    Nerve / Sites Muscle Latency Ref. Amplitude Ref. Rel Amp Segments Distance Velocity Ref. Area    ms ms mV mV %  cm m/s m/s mVms  L Median - APB     Wrist APB 3.4 ?4.4 7.6 ?4.0 100 Wrist - APB 7   29.5     Upper arm APB 6.8  7.7  101 Upper arm - Wrist 21 61 ?49 29.4  R Median - APB     Wrist APB 3.0 ?4.4 9.0 ?4.0 100 Wrist - APB 7   36.3     Upper arm APB 6.4  8.8  96.9 Upper arm - Wrist 22 64 ?49 34.8  L Ulnar - ADM     Wrist ADM 2.0 ?3.3  10.4 ?6.0 100 Wrist - ADM 7   43.4     B.Elbow ADM 4.9  10.7  103 B.Elbow - Wrist 19 65 ?49 43.0     A.Elbow ADM 6.6  11.0  104 A.Elbow - B.Elbow 10 61 ?49 50.8  R Ulnar - ADM     Wrist ADM 2.3 ?3.3 9.7 ?6.0 100 Wrist - ADM 7   43.0     B.Elbow ADM 5.2  9.2  95.2 B.Elbow - Wrist 19 64 ?49 41.9     A.Elbow ADM 6.8  9.4  102 A.Elbow - B.Elbow 10 64 ?49 41.3             SNC    Nerve / Sites Rec. Site Peak Lat Ref.  Amp Ref. Segments Distance Peak Diff Ref.    ms ms V V  cm ms ms  L Median, Ulnar - Transcarpal comparison     Median Palm Wrist 1.8 ?2.2 98 ?35 Median Palm - Wrist 8       Ulnar Palm Wrist 1.8 ?2.2 46 ?12 Ulnar Palm - Wrist 8          Median Palm - Ulnar Palm  0.0 ?0.4  R Median, Ulnar - Transcarpal comparison     Median Palm Wrist 1.8 ?2.2 128 ?35 Median Palm - Wrist 8       Ulnar  Palm Wrist 1.7 ?2.2 24 ?12 Ulnar Palm - Wrist 8          Median Palm - Ulnar Palm  0.1 ?0.4  L Median - Orthodromic (Dig II, Mid palm)     Dig II Wrist 2.6 ?3.4 20 ?10 Dig II - Wrist 13    R Median - Orthodromic (Dig II, Mid palm)     Dig II Wrist 2.7 ?3.4 15 ?10 Dig II - Wrist 13    L Ulnar - Orthodromic, (Dig V, Mid palm)     Dig V Wrist 2.3 ?3.1 15 ?5 Dig V - Wrist 11    R Ulnar - Orthodromic, (Dig V, Mid palm)     Dig V Wrist 2.2 ?3.1 8 ?5 Dig V - Wrist 54                   F  Wave    Nerve F Lat Ref.   ms ms  L Ulnar - ADM 26.0 ?32.0  R Ulnar - ADM 26.1 ?32.0         EMG Summary Table    Spontaneous MUAP Recruitment  Muscle IA Fib PSW Fasc Other Amp Dur. Poly Pattern  R Cervical paraspinals (low) Normal None None None _______ Normal Normal Normal Normal  R. Deltoid Normal None None None _______ Normal Normal Normal Normal  R. Triceps brachii Normal None None None _______ Normal Normal Normal Normal  R. Biceps brachii Normal None None None _______ Normal Normal Normal Normal  R Pronator teres Normal None None None _______ Normal Normal Normal Normal  R. First dorsal interosseous  Normal None None None _______ Normal Normal Normal Normal  R Opponens pollicis Normal None None None _______ Normal Normal Normal Normal

## 2020-02-12 DIAGNOSIS — R41840 Attention and concentration deficit: Secondary | ICD-10-CM | POA: Diagnosis not present

## 2020-02-12 DIAGNOSIS — F338 Other recurrent depressive disorders: Secondary | ICD-10-CM | POA: Diagnosis not present

## 2020-02-12 DIAGNOSIS — F419 Anxiety disorder, unspecified: Secondary | ICD-10-CM | POA: Diagnosis not present

## 2020-02-23 DIAGNOSIS — E890 Postprocedural hypothyroidism: Secondary | ICD-10-CM | POA: Diagnosis not present

## 2020-02-23 DIAGNOSIS — C73 Malignant neoplasm of thyroid gland: Secondary | ICD-10-CM | POA: Diagnosis not present

## 2020-03-23 DIAGNOSIS — M25572 Pain in left ankle and joints of left foot: Secondary | ICD-10-CM | POA: Diagnosis not present

## 2020-03-23 DIAGNOSIS — M79672 Pain in left foot: Secondary | ICD-10-CM | POA: Diagnosis not present

## 2020-04-21 DIAGNOSIS — E890 Postprocedural hypothyroidism: Secondary | ICD-10-CM | POA: Diagnosis not present

## 2020-04-21 DIAGNOSIS — C73 Malignant neoplasm of thyroid gland: Secondary | ICD-10-CM | POA: Diagnosis not present

## 2020-04-23 ENCOUNTER — Other Ambulatory Visit: Payer: Self-pay | Admitting: Endocrinology

## 2020-04-23 DIAGNOSIS — C73 Malignant neoplasm of thyroid gland: Secondary | ICD-10-CM

## 2020-04-30 DIAGNOSIS — Z79899 Other long term (current) drug therapy: Secondary | ICD-10-CM | POA: Diagnosis not present

## 2020-04-30 DIAGNOSIS — F419 Anxiety disorder, unspecified: Secondary | ICD-10-CM | POA: Diagnosis not present

## 2020-04-30 DIAGNOSIS — F902 Attention-deficit hyperactivity disorder, combined type: Secondary | ICD-10-CM | POA: Diagnosis not present

## 2020-04-30 DIAGNOSIS — F338 Other recurrent depressive disorders: Secondary | ICD-10-CM | POA: Diagnosis not present

## 2020-05-07 ENCOUNTER — Other Ambulatory Visit (HOSPITAL_COMMUNITY): Payer: Self-pay | Admitting: Endocrinology

## 2020-05-07 DIAGNOSIS — E890 Postprocedural hypothyroidism: Secondary | ICD-10-CM

## 2020-05-13 ENCOUNTER — Telehealth (INDEPENDENT_AMBULATORY_CARE_PROVIDER_SITE_OTHER): Payer: BC Managed Care – PPO | Admitting: Family Medicine

## 2020-05-13 ENCOUNTER — Encounter: Payer: Self-pay | Admitting: Family Medicine

## 2020-05-13 DIAGNOSIS — G43709 Chronic migraine without aura, not intractable, without status migrainosus: Secondary | ICD-10-CM | POA: Diagnosis not present

## 2020-05-13 MED ORDER — TROKENDI XR 100 MG PO CP24: 200.0000 mg | ORAL_CAPSULE | Freq: Every day | ORAL | 1 refills | Status: DC

## 2020-05-13 NOTE — Progress Notes (Addendum)
PATIENT: Tara Ferguson DOB: 1980-05-29  REASON FOR VISIT: follow up HISTORY FROM: patient  Virtual Visit via Telephone Note  I connected with Catalina Gravel on 05/13/20 at  2:30 PM EST by telephone and verified that I am speaking with the correct person using two identifiers.   I discussed the limitations, risks, security and privacy concerns of performing an evaluation and management service by telephone and the availability of in person appointments. I also discussed with the patient that there may be a patient responsible charge related to this service. The patient expressed understanding and agreed to proceed.   History of Present Illness:  05/13/20 Tara Ferguson is a 40 y.o. female here today for follow up for migraines. MRI normal. NCS/EMG normal. She has continued Trokendi 100mg  daily. Sumatriptan for abortive therapy. She was having daily headaches. Now 7-8 per month. She has taken sumatriptan 1-2 times on average per month. One dose does abort migraine. She feels that she get "whoosy" for 15-30 minutes afterwards. She is wanting to be a little more aggressive with headache management as she is having a hard time with performing job functions with headache. She has not tried Nurtec. She was previously taking Trokendi 200mg  daily with no adverse effects.    HPI:  Tara Ferguson is a 40 y.o. female here as requested by Kathyrn Lass, MD for headaches and hand numbness.  She has a past medical history of chronic migraines depression, headaches, vitamin B12 and vitamin D deficiency, obesity.  She has had headaches for many years, in the past she has had them daily, no inciting events or head trauma or new medication.  They have been worsening over the last year.  She wakes up with a headache sometimes but also developed during the day later in the morning, the headache lasts all day, bending over may make it worsen, laying down does not help, taking Excedrin does not help, they are  more in the back of her right side of the neck and her neck hurts, throbbing sensation, pulsating pounding, when she wakes up with a headache she is usually in her back, smoke can trigger, smells can trigger, positive nausea, a dark room helps, brother has migraines, she also has nausea skin tenderness in the occipital area.  No aura.  No medication overuse.  The past that had improved on Trokendi and she was off it for a long time however recently headaches and migraines became worse yes alread since thyroid cancer. Now she is back to where she was initially. She liked the topiramat a lot, she was very happy on the topamax, it helped a lot, it helped with appetite and also with her binge eating.  It helped with the headache and other things. She is having different symptoms though, dizziness, fuzziness, morning headaches, positional and she can wake up and headaches worse when laying down, she has blurred vision. Sudden dizzy spells are new. Also she is worried about her thyroid cancer. She is also having numbness of both arms, worse laying down. Worse when going to sleep. No other focal neurologic deficits, associated symptoms, inciting events or modifiable factors.  Reviewed notes, labs and imaging from outside physicians, which showed:  Personally reviewed DG cervical spine images and agree: EXAM: CERVICAL SPINE - 2-3 VIEW  COMPARISON: None.  FINDINGS: There is no evidence of cervical spine fracture or prevertebral soft tissue swelling. Alignment is normal. No other significant bone abnormalities are identified. Multiple surgical clips are noted in the patient's neck  likely related to prior thyroidectomy.  IMPRESSION: Negative cervical spine radiographs.  t4 free normal  Observations/Objective:  Generalized: Well developed, in no acute distress  Mentation: Alert oriented to time, place, history taking. Follows all commands speech and language fluent   Assessment and Plan:  40  y.o. year old female  has a past medical history of Carpal tunnel syndrome, bilateral, Depression, Hyperhidrosis, Migraines, Multinodular goiter, Pneumonia, PONV (postoperative nausea and vomiting), and Thyroid cancer ([place]). here with    ICD-10-CM   1. Chronic migraine without aura without status migrainosus, not intractable  G43.709     She reports about 50-60 % reduction in migraines since restarting Trokendi. She is tolerating it well with no adverse effects. She wishes to increase to previously prescribed dose of 200mg  daily. She was doing well on that dose and felt that migraines were well managed. New dose sent to pharmacy. Possible side effects reviewed. She will continue sumatriptan. May try Nurtec to see if it will be better tolerated at work. Healthy lifestyle habits advised. She will follow up with me via Mychart 6/22 at 11:30. She verbalizes understanding and agreement with this plan.   No orders of the defined types were placed in this encounter.   Meds ordered this encounter  Medications  . Topiramate ER (TROKENDI XR) 100 MG CP24    Sig: Take 200 mg by mouth at bedtime.    Dispense:  180 capsule    Refill:  1    Patient has copay card; she can have medication regardless of insurance approval or copay amount.    Order Specific Question:   Supervising Provider    Answer:   Melvenia Beam [[id number]]     Follow Up Instructions:  I discussed the assessment and treatment plan with the patient. The patient was provided an opportunity to ask questions and all were answered. The patient agreed with the plan and demonstrated an understanding of the instructions.   The patient was advised to call back or seek an in-person evaluation if the symptoms worsen or if the condition fails to improve as anticipated.  I provided 20 minutes of non-face-to-face time during this encounter. Patient located at their place of [place] during [place] visit. Provider is in the office.    Debbora Presto, NP   Made any corrections needed, and agree with history, physical, neuro exam,assessment and plan as stated.     Sarina Ill, MD [facility]

## 2020-05-14 ENCOUNTER — Ambulatory Visit
Admission: RE | Admit: 2020-05-14 | Discharge: 2020-05-14 | Disposition: A | Discharge status: Home / Self Care | Payer: BC Managed Care – PPO | Source: Ambulatory Visit | Attending: Endocrinology | Admitting: Endocrinology

## 2020-05-14 DIAGNOSIS — E890 Postprocedural hypothyroidism: Secondary | ICD-10-CM | POA: Diagnosis not present

## 2020-05-14 DIAGNOSIS — C73 Malignant neoplasm of thyroid gland: Secondary | ICD-10-CM

## 2020-05-14 DIAGNOSIS — Z85850 Personal history of malignant neoplasm of thyroid: Secondary | ICD-10-CM | POA: Diagnosis not present

## 2020-06-04 DIAGNOSIS — M25572 Pain in left ankle and joints of left foot: Secondary | ICD-10-CM | POA: Diagnosis not present

## 2020-06-04 DIAGNOSIS — M79672 Pain in left foot: Secondary | ICD-10-CM | POA: Diagnosis not present

## 2020-06-14 ENCOUNTER — Encounter (HOSPITAL_COMMUNITY): Payer: BC Managed Care – PPO | Attending: Endocrinology

## 2020-06-15 ENCOUNTER — Encounter (HOSPITAL_COMMUNITY): Admission: RE | Admit: 2020-06-15 | Payer: BC Managed Care – PPO | Source: Ambulatory Visit

## 2020-06-16 ENCOUNTER — Encounter (HOSPITAL_COMMUNITY): Payer: BC Managed Care – PPO

## 2020-06-18 ENCOUNTER — Encounter (HOSPITAL_COMMUNITY): Payer: BC Managed Care – PPO

## 2020-06-18 DIAGNOSIS — E890 Postprocedural hypothyroidism: Secondary | ICD-10-CM | POA: Diagnosis not present

## 2020-06-29 DIAGNOSIS — M25572 Pain in left ankle and joints of left foot: Secondary | ICD-10-CM | POA: Diagnosis not present

## 2020-06-29 DIAGNOSIS — M79672 Pain in left foot: Secondary | ICD-10-CM | POA: Diagnosis not present

## 2020-07-19 DIAGNOSIS — M9272 Juvenile osteochondrosis of metatarsus, left foot: Secondary | ICD-10-CM | POA: Diagnosis not present

## 2020-07-19 DIAGNOSIS — M7742 Metatarsalgia, left foot: Secondary | ICD-10-CM | POA: Diagnosis not present

## 2020-07-19 DIAGNOSIS — M722 Plantar fascial fibromatosis: Secondary | ICD-10-CM | POA: Diagnosis not present

## 2020-07-19 DIAGNOSIS — M6702 Short Achilles tendon (acquired), left ankle: Secondary | ICD-10-CM | POA: Diagnosis not present

## 2020-08-02 DIAGNOSIS — E669 Obesity, unspecified: Secondary | ICD-10-CM | POA: Diagnosis not present

## 2020-08-02 DIAGNOSIS — C73 Malignant neoplasm of thyroid gland: Secondary | ICD-10-CM | POA: Diagnosis not present

## 2020-08-02 DIAGNOSIS — E890 Postprocedural hypothyroidism: Secondary | ICD-10-CM | POA: Diagnosis not present

## 2020-08-04 ENCOUNTER — Other Ambulatory Visit: Payer: Self-pay | Admitting: Endocrinology

## 2020-08-04 DIAGNOSIS — C73 Malignant neoplasm of thyroid gland: Secondary | ICD-10-CM

## 2020-08-31 ENCOUNTER — Telehealth: Payer: BC Managed Care – PPO | Admitting: Family Medicine

## 2020-08-31 NOTE — Progress Notes (Deleted)
PATIENT: Tara Ferguson DOB: 01/25/1981  REASON FOR VISIT: follow up HISTORY FROM: patient  Virtual Visit via Telephone Note  I connected with Tara Ferguson on 08/31/20 at 11:30 AM EDT by telephone and verified that I am speaking with the correct person using two identifiers.   I discussed the limitations, risks, security and privacy concerns of performing an evaluation and management service by telephone and the availability of in person appointments. I also discussed with the patient that there may be a patient responsible charge related to this service. The patient expressed understanding and agreed to proceed.   History of Present Illness:  08/31/2020 ALL: We increased topiramate XR to 200mg  at last follow up. Sumatriptan was continued for abortive therapy. We also tried Nurtec.   05/13/2020 ALL: Tara Ferguson is a 40 y.o. female here today for follow up for migraines. MRI normal. NCS/EMG normal. She has continued Trokendi 100mg  daily. Sumatriptan for abortive therapy. She was having daily headaches. Now 7-8 per month. She has taken sumatriptan 1-2 times on average per month. One dose does abort migraine. She feels that she get "whoosy" for 15-30 minutes afterwards. She is wanting to be a little more aggressive with headache management as she is having a hard time with performing job functions with headache. She has not tried Nurtec. She was previously taking Trokendi 200mg  daily with no adverse effects.   HPI:  Tara Ferguson is a 40 y.o. female here as requested by Kathyrn Lass, MD for headaches and hand numbness.  She has a past medical history of chronic migraines depression, headaches, vitamin B12 and vitamin D deficiency, obesity.  She has had headaches for many years, in the past she has had them daily, no inciting events or head trauma or new medication.  They have been worsening over the last year.  She wakes up with a headache sometimes but also developed during the day later  in the morning, the headache lasts all day, bending over may make it worsen, laying down does not help, taking Excedrin does not help, they are more in the back of her right side of the neck and her neck hurts, throbbing sensation, pulsating pounding, when she wakes up with a headache she is usually in her back, smoke can trigger, smells can trigger, positive nausea, a dark room helps, brother has migraines, she also has nausea skin tenderness in the occipital area.  No aura.  No medication overuse.  The past that had improved on Trokendi and she was off it for a long time however recently headaches and migraines became worse yes alread since thyroid cancer. Now she is back to where she was initially. She liked the topiramat a lot, she was very happy on the topamax, it helped a lot, it helped with appetite and also with her binge eating.  It helped with the headache and other things. She is having different symptoms though, dizziness, fuzziness, morning headaches, positional and she can wake up and headaches worse when laying down, she has blurred vision. Sudden dizzy spells are new. Also she is worried about her thyroid cancer. She is also having numbness of both arms, worse laying down. Worse when going to sleep. No other focal neurologic deficits, associated symptoms, inciting events or modifiable factors.   Reviewed notes, labs and imaging from outside physicians, which showed:   Personally reviewed DG cervical spine images and agree: EXAM: CERVICAL SPINE - 2-3 VIEW   COMPARISON:  None.   FINDINGS: There is no evidence  of cervical spine fracture or prevertebral soft tissue swelling. Alignment is normal. No other significant bone abnormalities are identified. Multiple surgical clips are noted in the patient's neck likely related to prior thyroidectomy.   IMPRESSION: Negative cervical spine radiographs.   t4 free normal  Observations/Objective:  Generalized: Well developed, in no acute  distress  Mentation: Alert oriented to time, place, history taking. Follows all commands speech and language fluent   Assessment and Plan:  40 y.o. year old female  has a past medical history of Carpal tunnel syndrome, bilateral, Depression, Hyperhidrosis, Migraines, Multinodular goiter, Pneumonia, PONV (postoperative nausea and vomiting), and Thyroid cancer ([place]). here with  No diagnosis found.   She reports about 50-60 % reduction in migraines since restarting Trokendi. She is tolerating it well with no adverse effects. She wishes to increase to previously prescribed dose of 200mg  daily. She was doing well on that dose and felt that migraines were well managed. New dose sent to pharmacy. Possible side effects reviewed. She will continue sumatriptan. May try Nurtec to see if it will be better tolerated at work. Healthy lifestyle habits advised. She will follow up with me via Mychart 6/22 at 11:30. She verbalizes understanding and agreement with this plan.   No orders of the defined types were placed in this encounter.   No orders of the defined types were placed in this encounter.    Follow Up Instructions:  I discussed the assessment and treatment plan with the patient. The patient was provided an opportunity to ask questions and all were answered. The patient agreed with the plan and demonstrated an understanding of the instructions.   The patient was advised to call back or seek an in-person evaluation if the symptoms worsen or if the condition fails to improve as anticipated.  I provided 20 minutes of non-face-to-face time during this encounter. Patient located at their place of [place] during [place] visit. Provider is in the office.    Debbora Presto, NP

## 2020-10-12 DIAGNOSIS — E890 Postprocedural hypothyroidism: Secondary | ICD-10-CM | POA: Diagnosis not present

## 2020-11-10 DIAGNOSIS — K219 Gastro-esophageal reflux disease without esophagitis: Secondary | ICD-10-CM | POA: Diagnosis not present

## 2020-11-10 DIAGNOSIS — R070 Pain in throat: Secondary | ICD-10-CM | POA: Diagnosis not present

## 2020-12-09 DIAGNOSIS — K219 Gastro-esophageal reflux disease without esophagitis: Secondary | ICD-10-CM | POA: Diagnosis not present

## 2020-12-09 DIAGNOSIS — K921 Melena: Secondary | ICD-10-CM | POA: Diagnosis not present

## 2020-12-09 DIAGNOSIS — K5900 Constipation, unspecified: Secondary | ICD-10-CM | POA: Diagnosis not present

## 2020-12-09 DIAGNOSIS — R140 Abdominal distension (gaseous): Secondary | ICD-10-CM | POA: Diagnosis not present

## 2021-02-14 DIAGNOSIS — E669 Obesity, unspecified: Secondary | ICD-10-CM | POA: Diagnosis not present

## 2021-02-14 DIAGNOSIS — E890 Postprocedural hypothyroidism: Secondary | ICD-10-CM | POA: Diagnosis not present

## 2021-02-14 DIAGNOSIS — C73 Malignant neoplasm of thyroid gland: Secondary | ICD-10-CM | POA: Diagnosis not present

## 2021-02-15 DIAGNOSIS — C73 Malignant neoplasm of thyroid gland: Secondary | ICD-10-CM | POA: Diagnosis not present

## 2021-04-01 IMAGING — US US THYROID
1 series · 16 of 25 positions shown · non-contrast
Comparison: None.

CLINICAL DATA: Palpable abnormality. Palpable area involving the
anterior aspect the right side of the neck for the past month.
Evaluate for thyroid nodule.

EXAM:
THYROID ULTRASOUND
TECHNIQUE: Ultrasound examination of the thyroid gland and adjacent soft
tissues was performed.

[Series 1: us thyroid · 0.08mm/px · 16 of 82 slices shown]
[im 1/82]
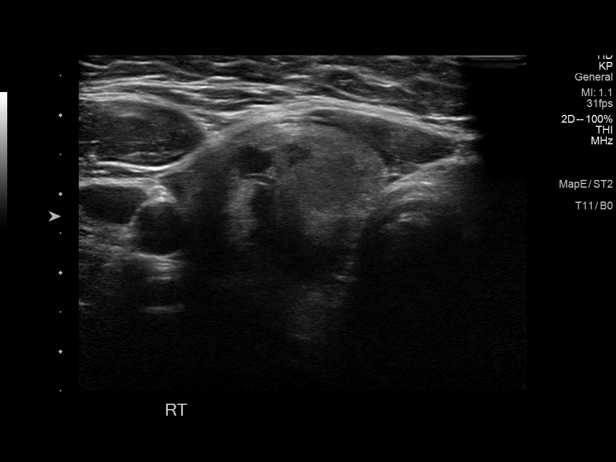
[im 7/82]
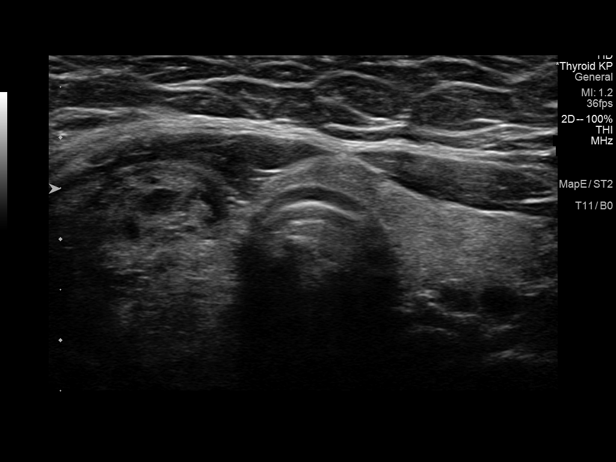
[im 11/82]
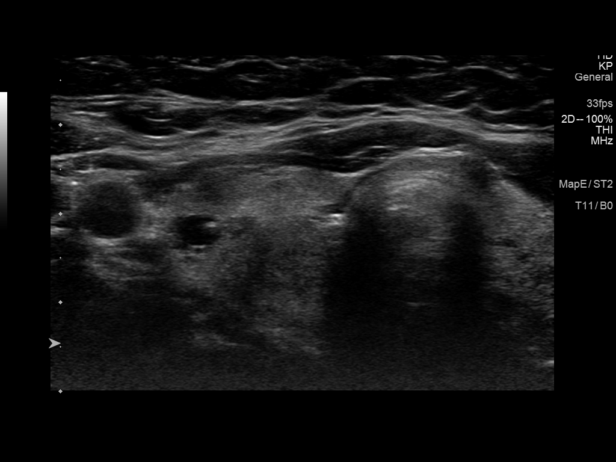
[im 17/82]
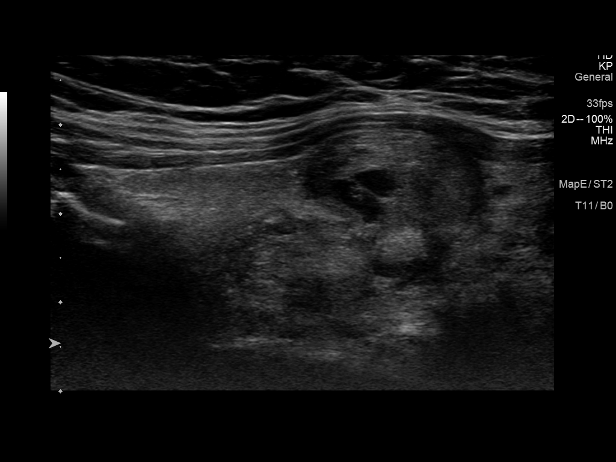
[im 24/82]
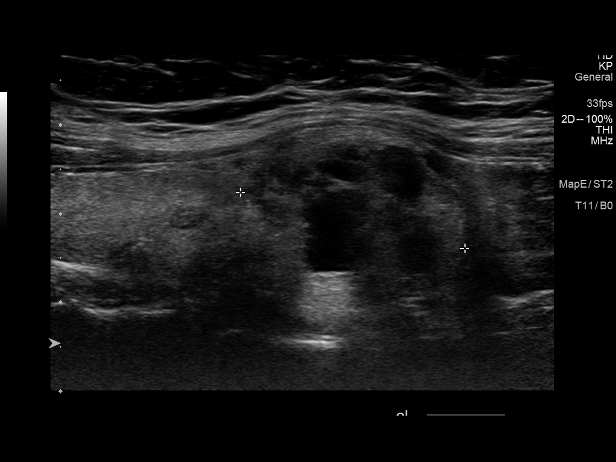
[im 28/82]
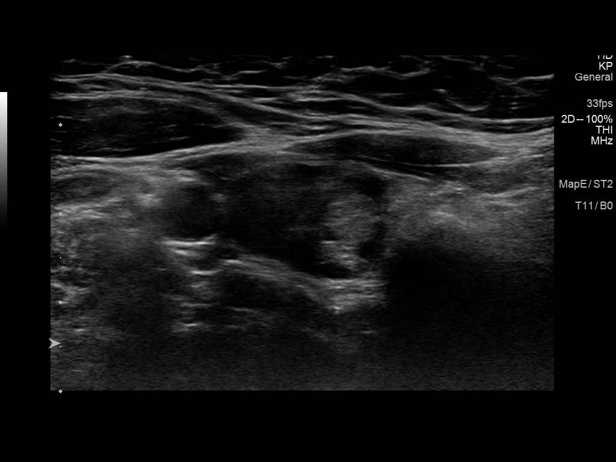
[im 34/82]
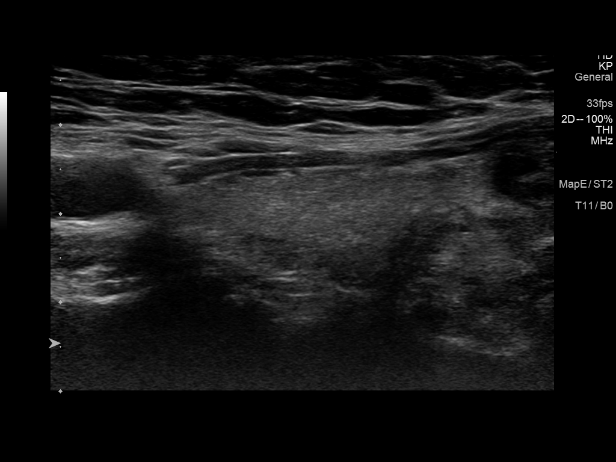
[im 38/82]
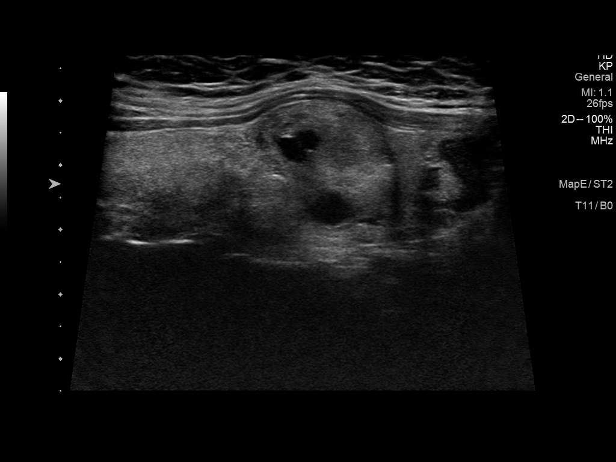
[im 44/82]
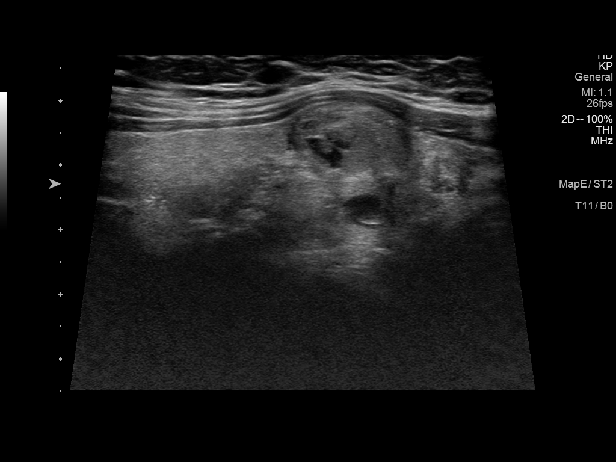
[im 48/82]
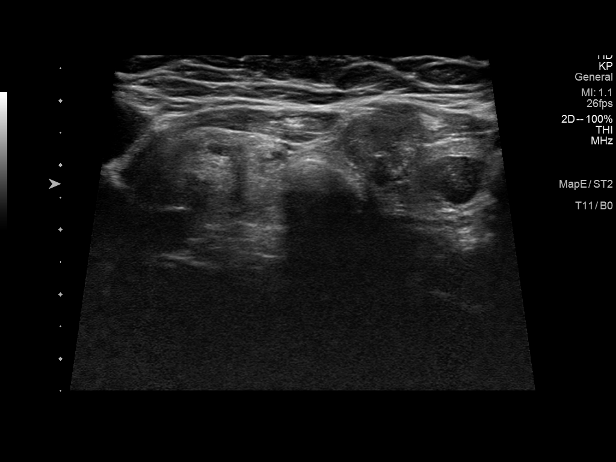
[im 55/82]
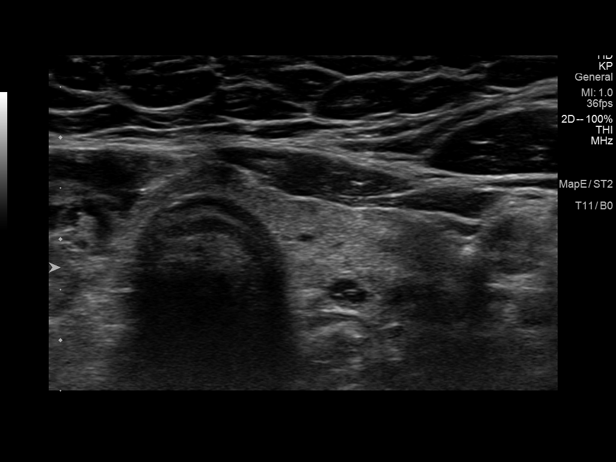
[im 58/82]
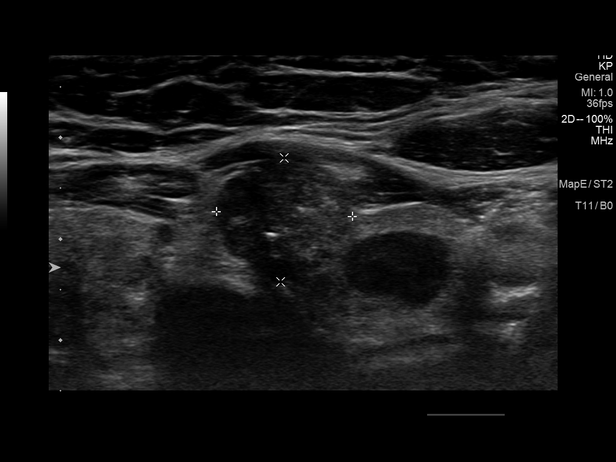
[im 65/82]
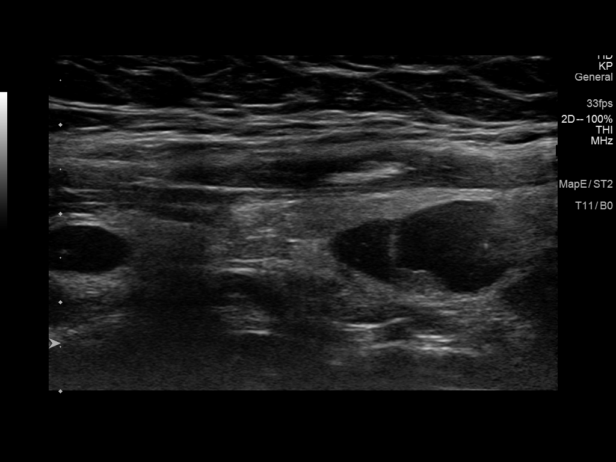
[im 71/82]
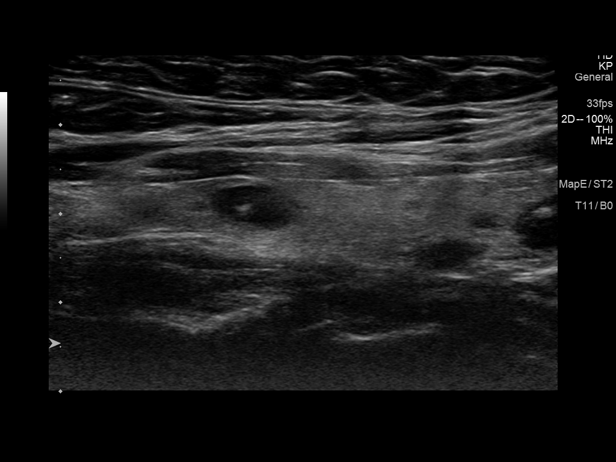
[im 75/82]
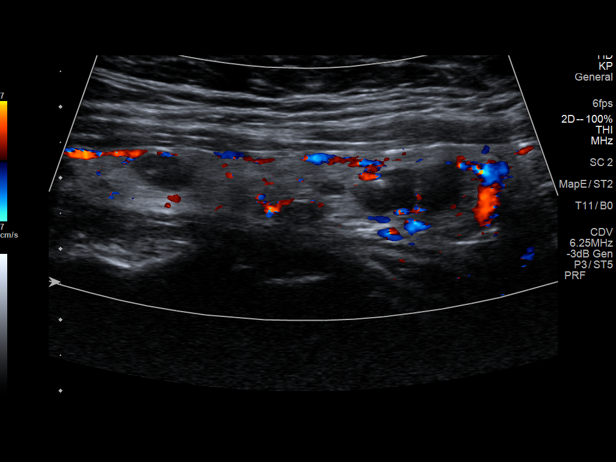
[im 82/82]
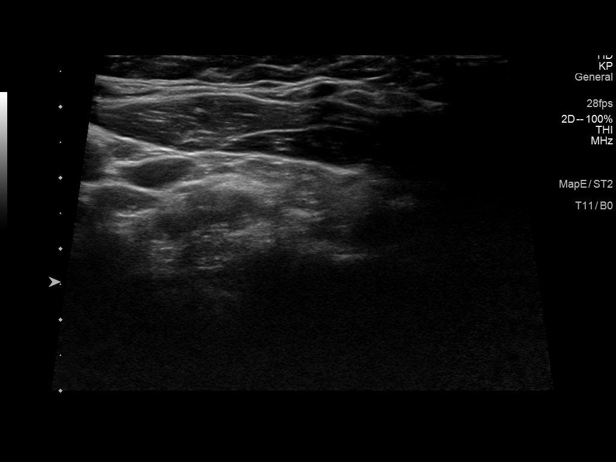

[16 of 25 positions shown; findings below may reference images not displayed]

FINDINGS: Parenchymal Echotexture: Mildly heterogenous

Isthmus: Normal in size measures 0.3 cm in diameter

Right lobe: Enlarged measuring 6.8 x 2.5 x 2.1 cm

Left lobe: There is enlarged measuring 6.5 x 1.4 x 1.9 cm

_________________________________________________________

Estimated total number of nodules >/= 1 cm: 6-10

Number of spongiform nodules >/=  2 cm not described below (TR1): 0

Number of mixed cystic and solid nodules >/= 1.5 cm not described
below (TR2): 0

_________________________________________________________

There is an approximately 0.5 cm hypoechoic nodule in the left-sided
thyroid isthmus which does not meet imaging criteria to recommend
percutaneous sampling or continued dedicated follow-up

_________________________________________________________

There is an approximately 0.8 x 0.7 x 0.6 cm partially solid,
partially cystic nodule within mid aspect the right lobe of the
thyroid (labeled 1), which does not meet imaging criteria to
recommend percutaneous sampling or continued dedicated follow-up.

_________________________________________________________

Nodule # 2:

Location: Right; Mid

Maximum size: 1.7 cm; Other 2 dimensions: 1.4 x 1.2 cm

Composition: solid/almost completely solid (2)

Echogenicity: isoechoic (1)

Shape: not taller-than-wide (0)

Margins: ill-defined (0)

Echogenic foci: none (0)

ACR TI-RADS total points: 3.

ACR TI-RADS risk category: TR3 (3 points).

ACR TI-RADS recommendations:

*Given size (>/= 1.5 - 2.4 cm) and appearance, a follow-up
ultrasound in 1 year should be considered based on TI-RADS criteria.

_________________________________________________________

Nodule # 3:

Location: Right; Inferior - by report this nodule correlates with
the patient's palpable area of concern

Maximum size: 2.6 cm; Other 2 dimensions: 2.2 x 1.9 cm

Composition: solid/almost completely solid (2)

Echogenicity: isoechoic (1)

Shape: not taller-than-wide (0)

Margins: ill-defined (0)

Echogenic foci: none (0)

ACR TI-RADS total points: 3.

ACR TI-RADS risk category: TR3 (3 points).

ACR TI-RADS recommendations:

**Given size (>/= 2.5 cm) and appearance, fine needle aspiration of
this mildly suspicious nodule should be considered based on TI-RADS
criteria.

_________________________________________________________

There is an approximately 1.8 x 1.4 x 1.2 cm partially cystic,
partially solid nodule within the inferior pole the right lobe of
the thyroid (labeled 4), which does not meet imaging criteria to
recommend percutaneous sampling or continued dedicated follow-up

_________________________________________________________

There is an approximately 1.1 x 0.6 x 0.6 cm anechoic cyst within
the superior pole the left lobe of the thyroid (labeled 5), which
contains an internal echogenic foci with ring down artifact
compatible with benign colloid. This benign colloid containing cyst
does not meet imaging criteria to recommend percutaneous sampling or
continued dedicated follow-up

_________________________________________________________

Nodule # 6:

Location: Left; Inferior

Maximum size: 1.3 cm; Other 2 dimensions: 1.2 x 1.2 cm

Composition: solid/almost completely solid (2)

Echogenicity: hypoechoic (2)

Shape: not taller-than-wide (0)

Margins: smooth (0)

Echogenic foci: punctate echogenic foci (3)

ACR TI-RADS total points: 7.

ACR TI-RADS risk category: TR5 (>/= 7 points).

ACR TI-RADS recommendations:

**Given size (>/= 1.0 cm) and appearance, fine needle aspiration of
this highly suspicious nodule should be considered based on TI-RADS
criteria.

_________________________________________________________

There are 2 adjacent anechoic cysts within the inferior pole the
left lobe of the thyroid one of which measures approximately 0.8 cm
and the adjacent cyst measuring 1.5 cm both of which contain
internal echogenic foci with ring down artifact compatible with
benign colloid. Neither of these colloid containing cysts meet
imaging criteria to recommend percutaneous sampling or continued
dedicated follow-up.
IMPRESSION: 1. Findings suggestive of multinodular goiter.
2. Nodule #3 (which correlates with the patient's palpable area of
concern) and #6 both meet imaging criteria to recommend percutaneous
sampling as clinically indicated.
3. Nodule #2 meets imaging criteria to recommend a 1 year follow-up.

The above is in keeping with the ACR TI-RADS recommendations - [HOSPITAL] 3683;[DATE].

## 2021-04-14 DIAGNOSIS — C73 Malignant neoplasm of thyroid gland: Secondary | ICD-10-CM | POA: Diagnosis not present

## 2021-04-21 DIAGNOSIS — E669 Obesity, unspecified: Secondary | ICD-10-CM | POA: Diagnosis not present

## 2021-04-21 DIAGNOSIS — E890 Postprocedural hypothyroidism: Secondary | ICD-10-CM | POA: Diagnosis not present

## 2021-04-21 DIAGNOSIS — C73 Malignant neoplasm of thyroid gland: Secondary | ICD-10-CM | POA: Diagnosis not present

## 2021-05-02 IMAGING — US US FNA BIOPSY THYROID 1ST LESION
1 series · 13 of 25 positions shown · non-contrast
Comparison: Thyroid ultrasound dated 10/14/2018

MEDICATIONS:
None

COMPLICATIONS:
None immediate.

INDICATION: Patient with history of palpable thyroid nodule and thyroid
ultrasound on 10/14/2018 which revealed multinodular goiter with
cm right inferior and 1.3 cm left inferior nodules which meet
criteria for biopsy. She presents today for the procedure.

EXAM:
ULTRASOUND GUIDED FINE NEEDLE ASPIRATION BIOPSIES OF RIGHT INFERIOR
AND LEFT INFERIOR THYROID NODULES
TECHNIQUE: Informed written consent was obtained from the patient after a
discussion of the risks, benefits and alternatives to treatment.
Questions regarding the procedure were encouraged and answered. A
timeout was performed prior to the initiation of the procedure.

[Series 1: us fna biopsy thyroid 1st lesion · 0.06mm/px · 25 acquisitions, 13 frames shown]
[im 1/25]
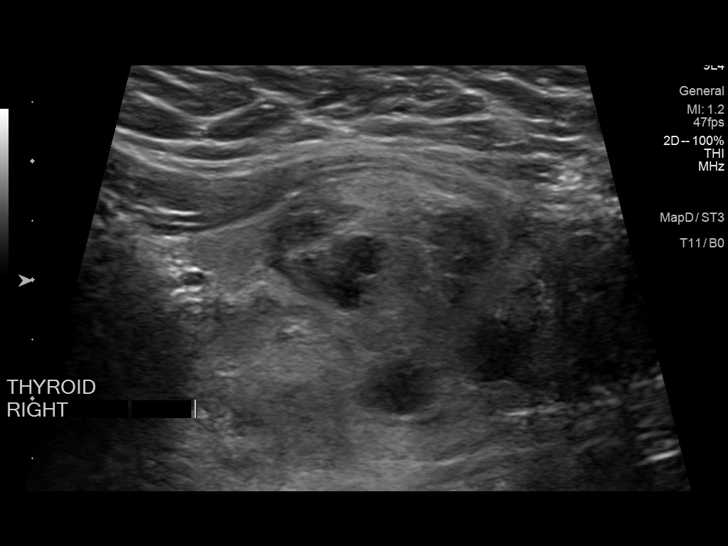
[im 3/25]
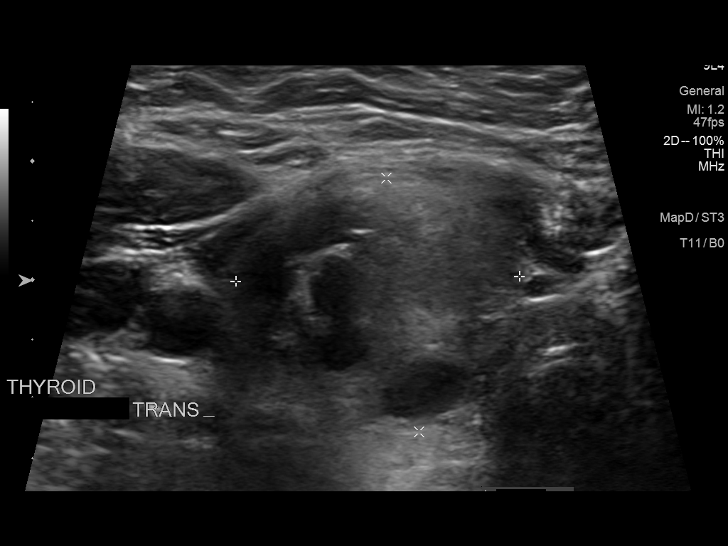
[im 5/25]
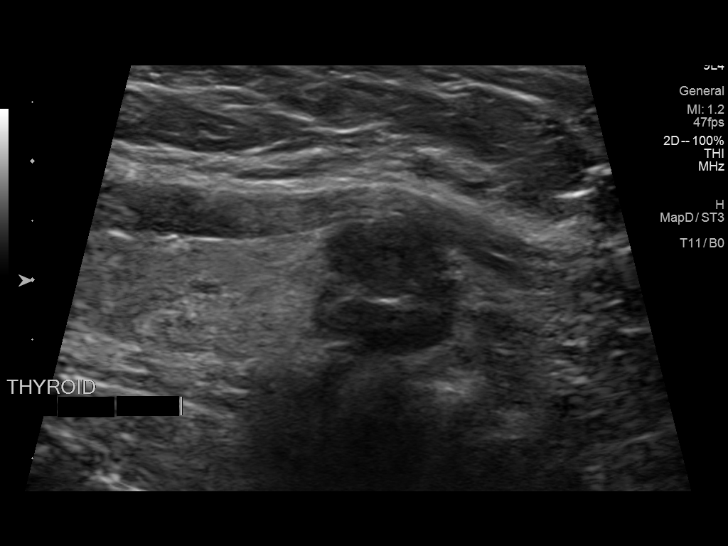
[im 7/25]
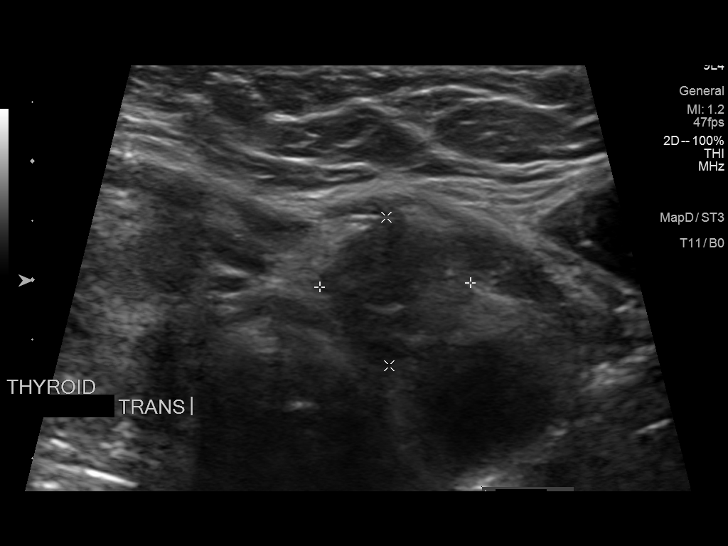
[im 9/25]
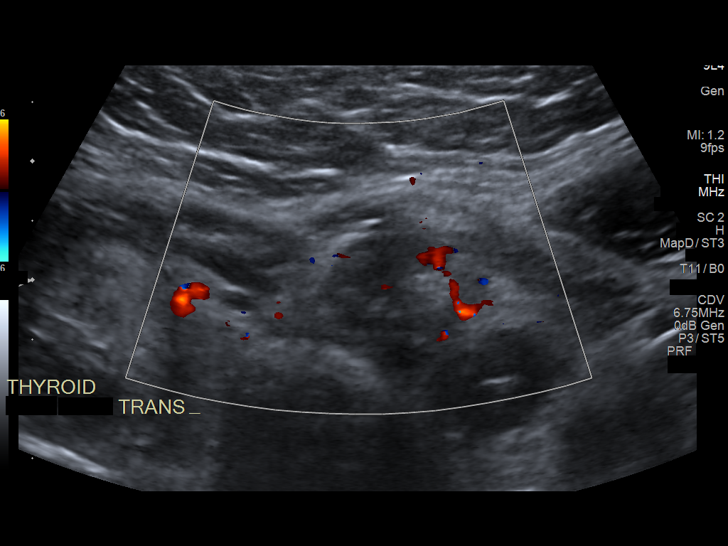
[im 11/25]
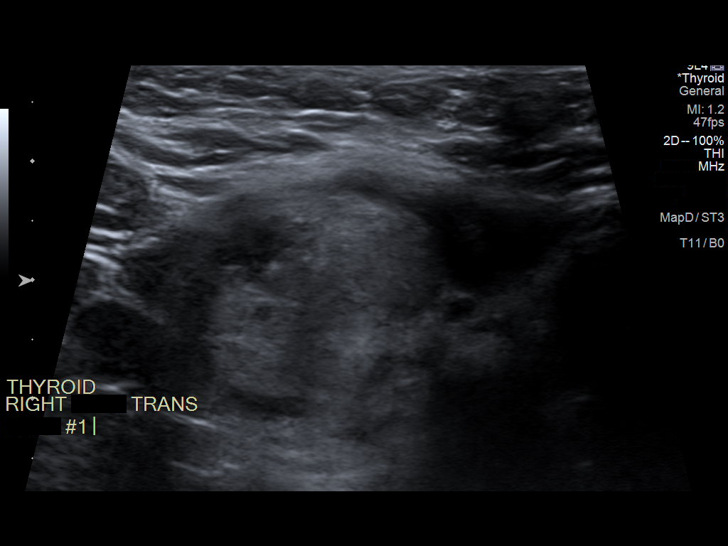
[im 13/25]
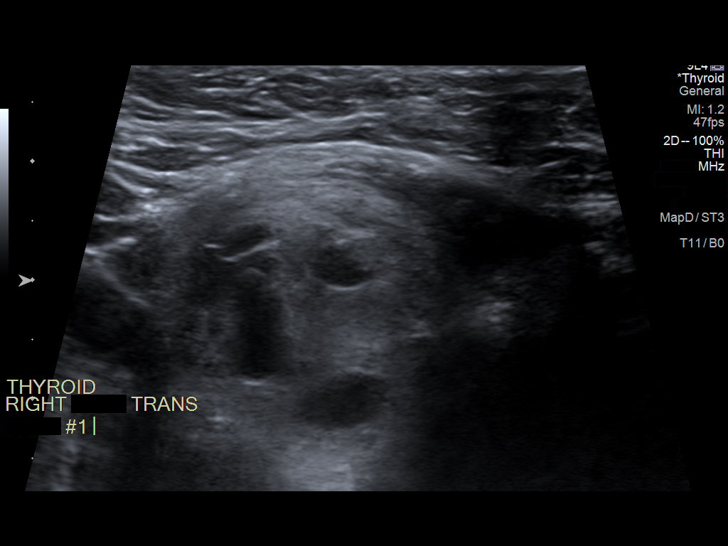
[im 15/25]
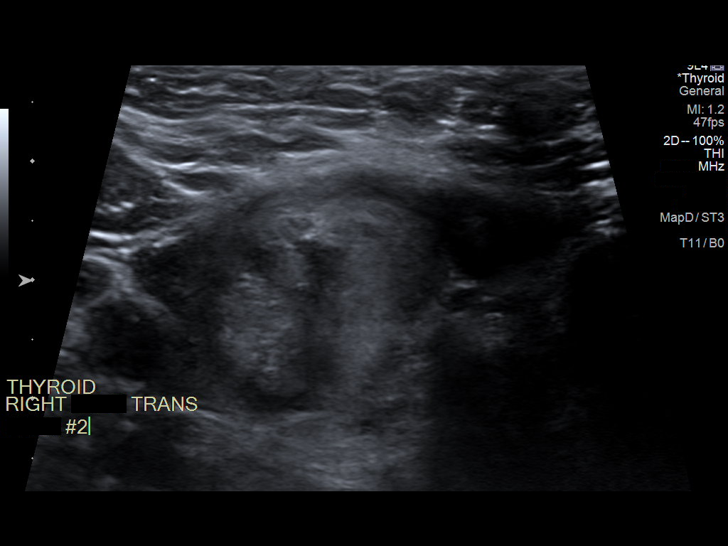
[im 17/25]
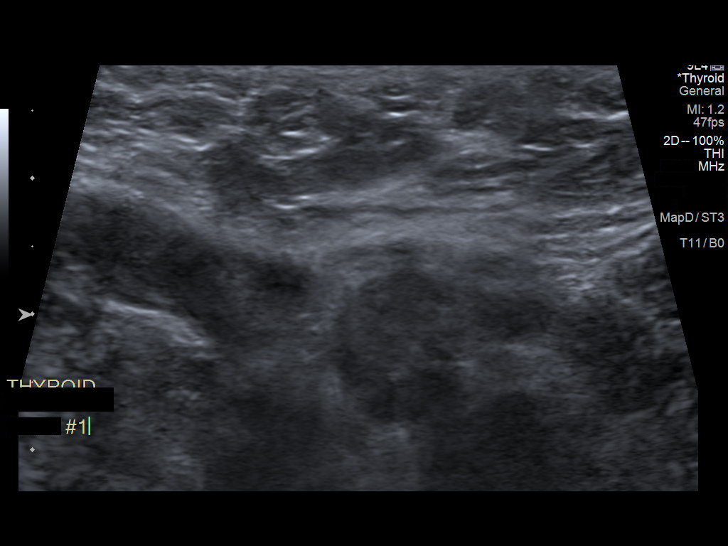
[im 19/25]
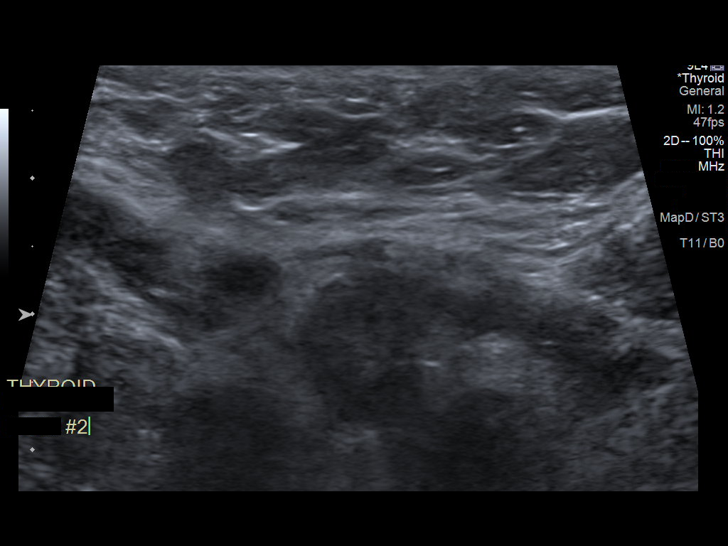
[im 21/25]
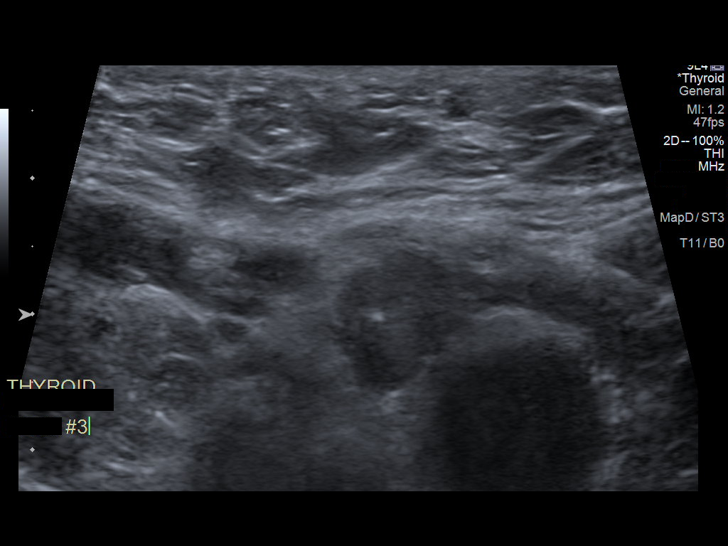
[im 23/25]
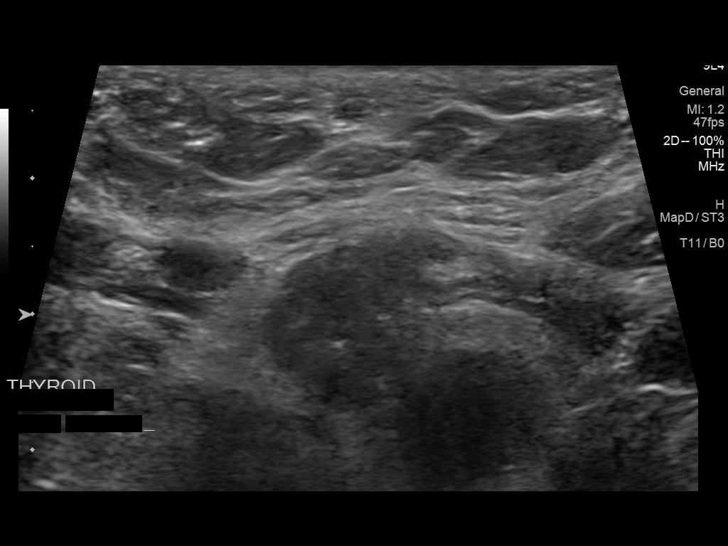
[im 25/25]
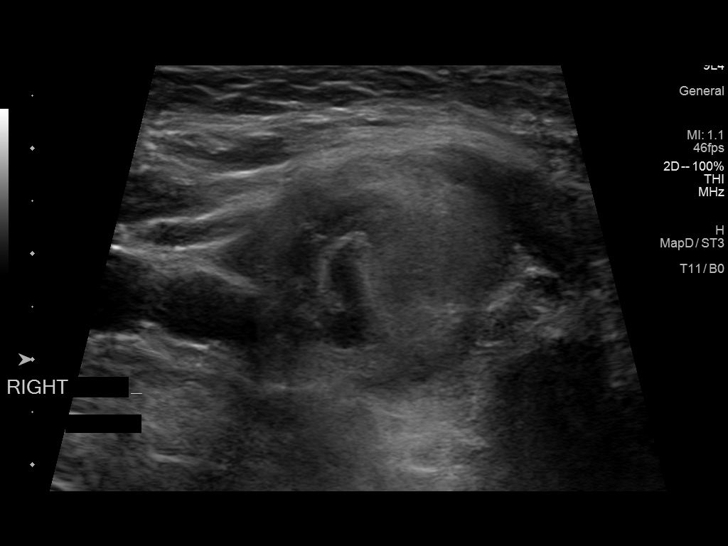

[13 of 25 positions shown; findings below may reference images not displayed]

Pre-procedural ultrasound scanning demonstrated unchanged size and
appearance of the indeterminate nodules within the right inferior
and left inferior thyroid lobes

The procedure was planned. The neck was prepped in the usual sterile
fashion, and a sterile drape was applied covering the operative
field. A timeout was performed prior to the initiation of the
procedure. Local anesthesia was provided with 1% lidocaine.

Under direct ultrasound guidance, 3 FNA biopsies were performed of
the right inferior thyroid nodule with 25 gauge needles. Multiple
ultrasound images were saved for procedural documentation purposes.
The samples were prepared and submitted to pathology.

Under direct ultrasound guidance, 3 FNA biopsies were performed of
the left inferior thyroid nodule with 25 gauge needles. Multiple
ultrasound images were saved for procedural documentation purposes.
The samples were prepared and submitted to pathology.

Limited post procedural scanning was negative for hematoma or
additional complication. Dressings were placed. The patient
tolerated the above procedures procedure well without immediate
postprocedural complication.
FINDINGS: Nodule reference number based on prior diagnostic ultrasound: 3

Maximum size: 2.6 cm

Location: Right; Inferior

ACR TI-RADS risk category: TR3 (3 points)

Reason for biopsy: meets ACR TI-RADS criteria

_________________________________________________________

Nodule reference number based on prior diagnostic ultrasound: 6

Maximum size: 1.3 cm

Location: Left; Inferior

ACR TI-RADS risk category: TR5 (>/= 7 points)

Reason for biopsy: meets ACR TI-RADS criteria

Ultrasound imaging confirms appropriate placement of the needles
within the thyroid nodules.
IMPRESSION: 1. Technically successful ultrasound guided fine needle aspiration
biopsy of right inferior thyroid nodule.
2. Technically successful ultrasound guided fine needle aspiration
biopsy of left inferior thyroid nodule. Final pathology pending.

## 2021-05-05 ENCOUNTER — Telehealth: Payer: Self-pay | Admitting: Neurology

## 2021-05-05 ENCOUNTER — Telehealth: Payer: Self-pay | Admitting: *Deleted

## 2021-05-05 ENCOUNTER — Ambulatory Visit (INDEPENDENT_AMBULATORY_CARE_PROVIDER_SITE_OTHER): Payer: Self-pay | Admitting: Neurology

## 2021-05-05 ENCOUNTER — Encounter: Payer: Self-pay | Admitting: Neurology

## 2021-05-05 DIAGNOSIS — Z91199 Patient's noncompliance with other medical treatment and regimen due to unspecified reason: Secondary | ICD-10-CM

## 2021-05-05 NOTE — Telephone Encounter (Signed)
Tara Ferguson no showed her patient appointment today.  She has an 20% no-show rate in epic.  This is NOT her first no-show here at [facility], neuro.  If patient calls back please have a talk with her, we have a lot of patients awaiting appointments and its really not fair to them when people just do not show up, please ask her not to make an appointment unless she intends on coming.  Something could have happened today and in that case we totally understand and we are happy to see her back but if she no-shows again she will be dismissed from our practice.

## 2021-05-05 NOTE — Telephone Encounter (Signed)
Pt no showed appt today

## 2021-05-05 NOTE — Progress Notes (Signed)
Babs Searson no showed her patient appointment today.  She has an 20% no-show rate in epic.  This is NOT her first no-show here at [facility], neuro.  If patient calls back please have a talk with her, we have a lot of patients awaiting appointments and its really not fair to them when people just do not show up, please ask her not to make an appointment unless she intends on coming.  Something could have happened today and in that case we totally understand and we are happy to see her back but if she no-shows again she will be dismissed from our practice.

## 2021-05-10 ENCOUNTER — Ambulatory Visit (INDEPENDENT_AMBULATORY_CARE_PROVIDER_SITE_OTHER): Payer: BC Managed Care – PPO | Admitting: Family Medicine

## 2021-05-10 DIAGNOSIS — E559 Vitamin D deficiency, unspecified: Secondary | ICD-10-CM | POA: Diagnosis not present

## 2021-05-10 DIAGNOSIS — Z6835 Body mass index (BMI) 35.0-35.9, adult: Secondary | ICD-10-CM | POA: Diagnosis not present

## 2021-05-10 DIAGNOSIS — R635 Abnormal weight gain: Secondary | ICD-10-CM | POA: Diagnosis not present

## 2021-05-10 DIAGNOSIS — N951 Menopausal and female climacteric states: Secondary | ICD-10-CM | POA: Diagnosis not present

## 2021-05-10 DIAGNOSIS — R5382 Chronic fatigue, unspecified: Secondary | ICD-10-CM | POA: Diagnosis not present

## 2021-05-12 DIAGNOSIS — R6882 Decreased libido: Secondary | ICD-10-CM | POA: Diagnosis not present

## 2021-05-12 DIAGNOSIS — Z1339 Encounter for screening examination for other mental health and behavioral disorders: Secondary | ICD-10-CM | POA: Diagnosis not present

## 2021-05-12 DIAGNOSIS — R5382 Chronic fatigue, unspecified: Secondary | ICD-10-CM | POA: Diagnosis not present

## 2021-05-12 DIAGNOSIS — E559 Vitamin D deficiency, unspecified: Secondary | ICD-10-CM | POA: Diagnosis not present

## 2021-05-12 DIAGNOSIS — Z6835 Body mass index (BMI) 35.0-35.9, adult: Secondary | ICD-10-CM | POA: Diagnosis not present

## 2021-05-12 DIAGNOSIS — Z1331 Encounter for screening for depression: Secondary | ICD-10-CM | POA: Diagnosis not present

## 2021-05-24 ENCOUNTER — Ambulatory Visit (INDEPENDENT_AMBULATORY_CARE_PROVIDER_SITE_OTHER): Payer: BC Managed Care – PPO | Admitting: Family Medicine

## 2021-06-02 DIAGNOSIS — E559 Vitamin D deficiency, unspecified: Secondary | ICD-10-CM | POA: Diagnosis not present

## 2021-06-02 DIAGNOSIS — Z6835 Body mass index (BMI) 35.0-35.9, adult: Secondary | ICD-10-CM | POA: Diagnosis not present

## 2021-06-13 DIAGNOSIS — Z6835 Body mass index (BMI) 35.0-35.9, adult: Secondary | ICD-10-CM | POA: Diagnosis not present

## 2021-06-13 DIAGNOSIS — E559 Vitamin D deficiency, unspecified: Secondary | ICD-10-CM | POA: Diagnosis not present

## 2021-06-13 DIAGNOSIS — R635 Abnormal weight gain: Secondary | ICD-10-CM | POA: Diagnosis not present

## 2021-06-30 IMAGING — CT CT ANGIO CHEST
2 of 6 series · 19 of 36 positions shown · IV contrast (omnipaque)
Comparison: Chest x-ray today

CLINICAL DATA: Chest pain

EXAM:
CT ANGIOGRAPHY CHEST WITH CONTRAST
TECHNIQUE: Multidetector CT imaging of the chest was performed using the
standard protocol during bolus administration of intravenous
contrast. Multiplanar CT image reconstructions and MIPs were
obtained to evaluate the vascular anatomy.
CONTRAST:  100mL OMNIPAQUE IOHEXOL 350 MG/ML SOLN

[Series 5: thins · axial · 0.70mm/px · z∈[-253,-19]mm · 18 of 261 slices shown]
[im 14/261  lung]
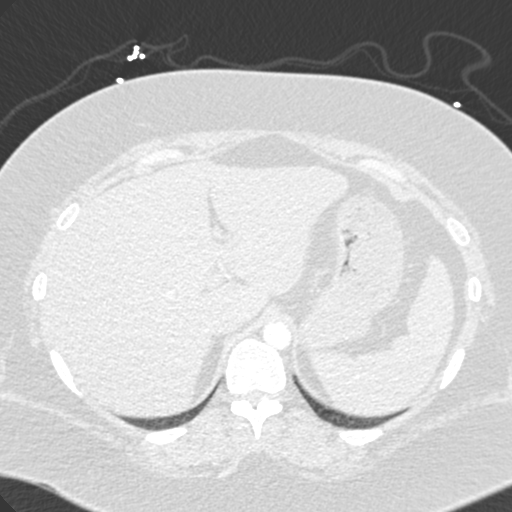
[im 27/261  mediastinal]
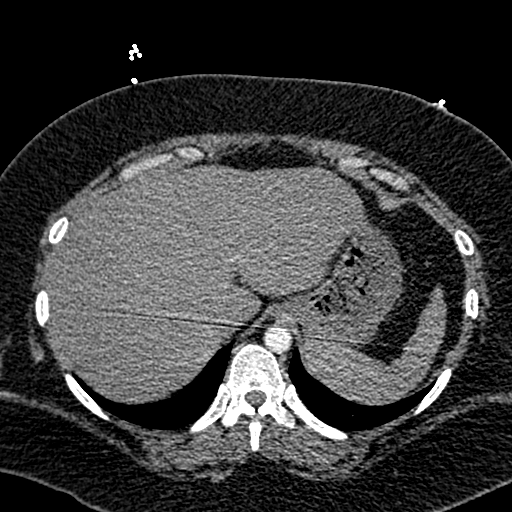
[im 40/261  lung]
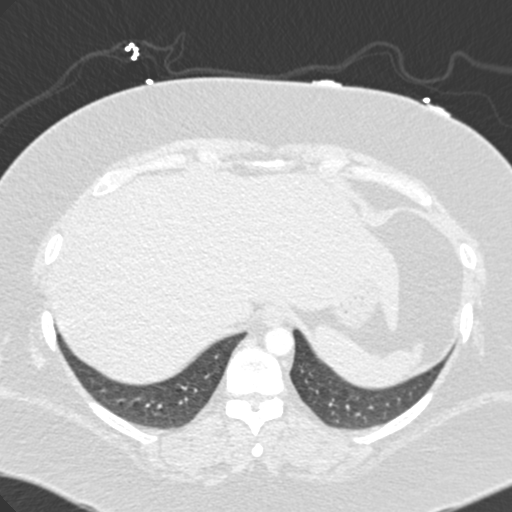
[im 53/261  mediastinal]
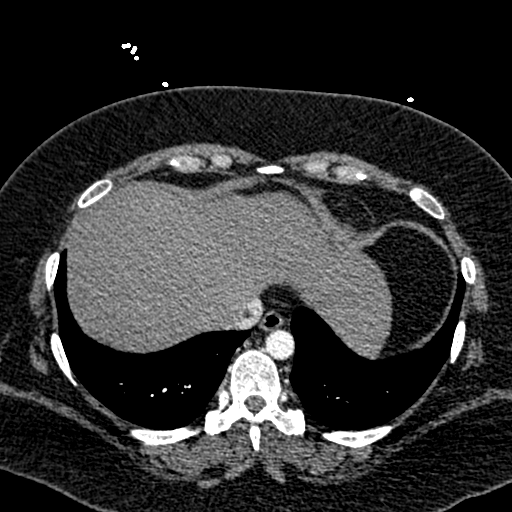
[im 66/261  lung]
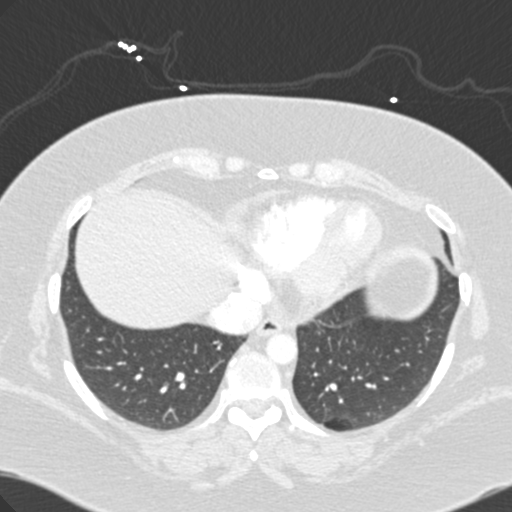
[im 79/261  mediastinal]
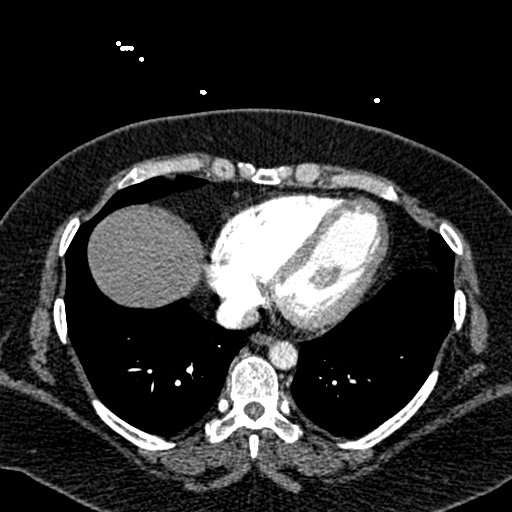
[im 92/261  lung]
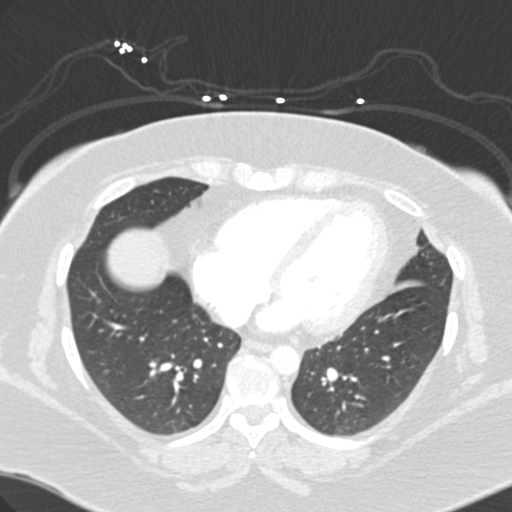
[im 105/261  mediastinal]
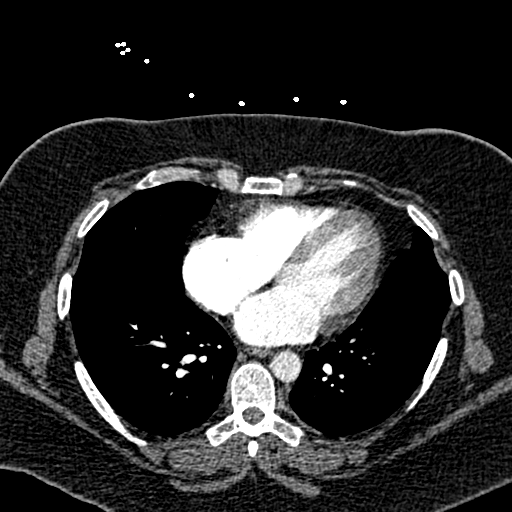
[im 118/261  lung]
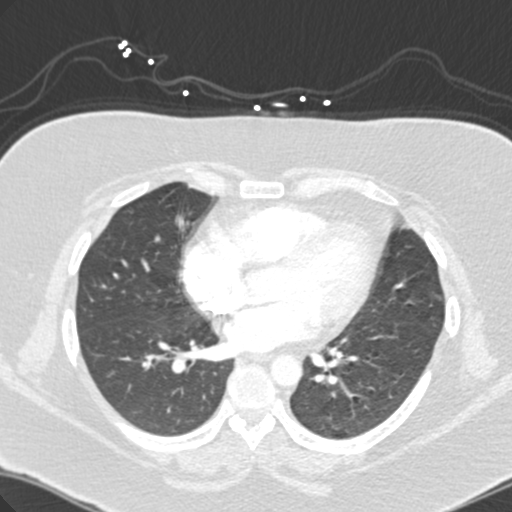
[im 144/261  mediastinal]
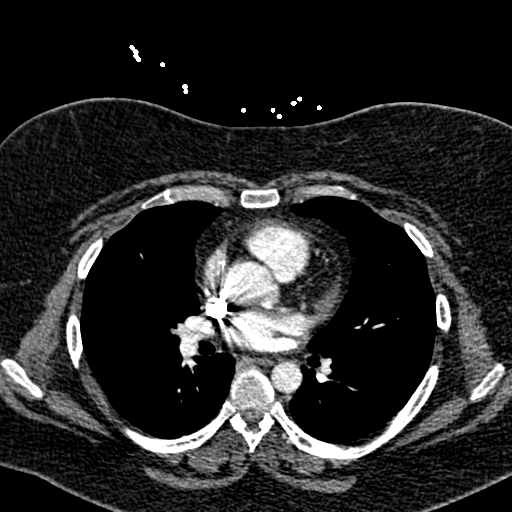
[im 157/261  lung]
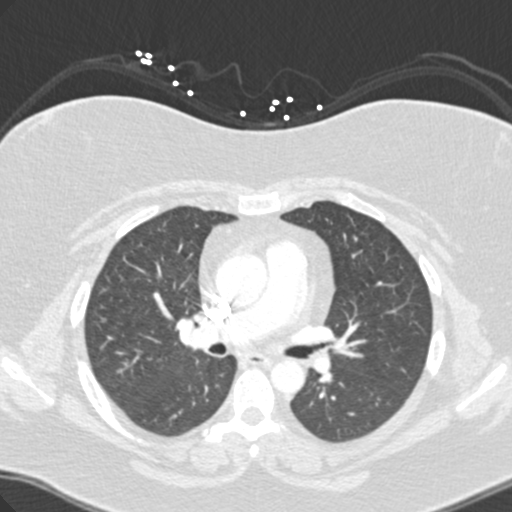
[im 170/261  mediastinal]
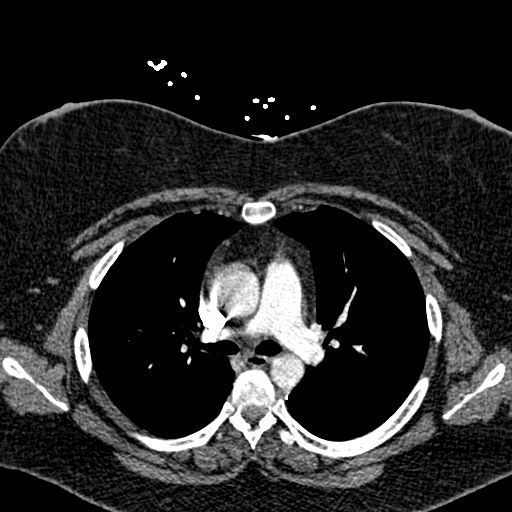
[im 183/261  lung]
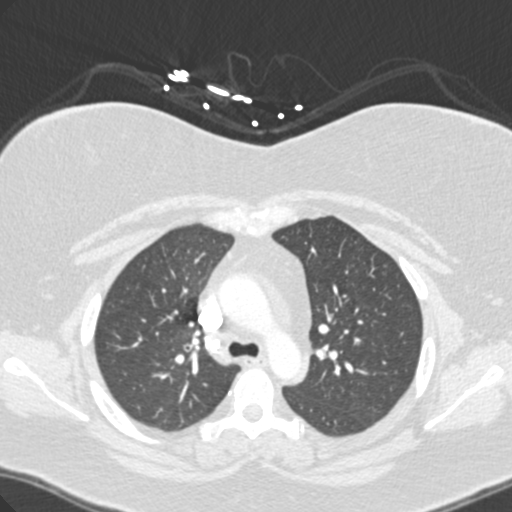
[im 196/261  mediastinal]
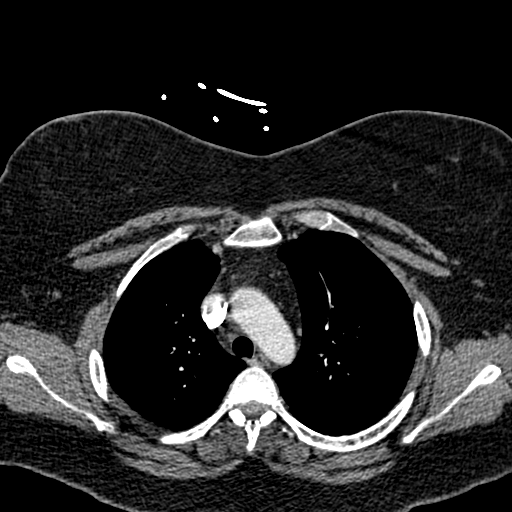
[im 209/261  lung]
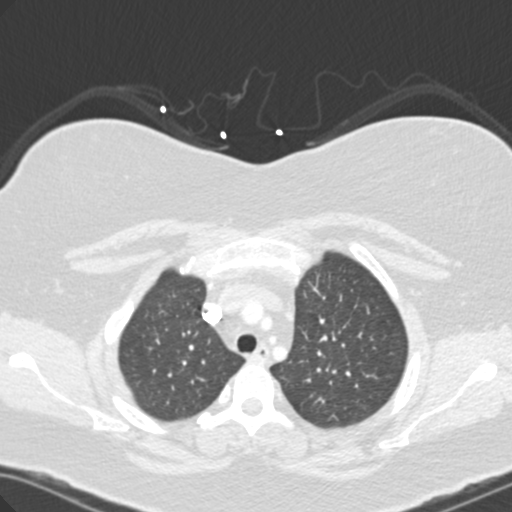
[im 222/261  mediastinal]
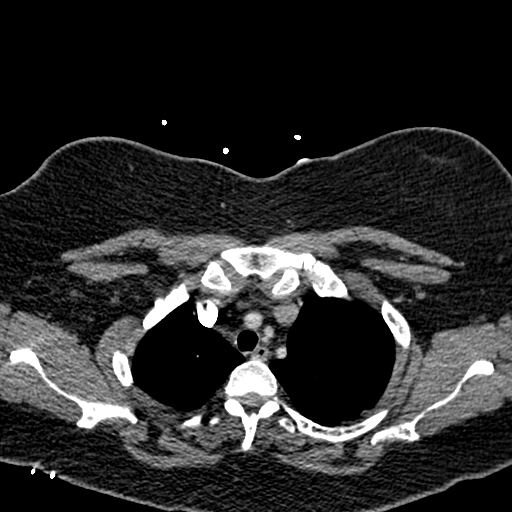
[im 235/261  lung]
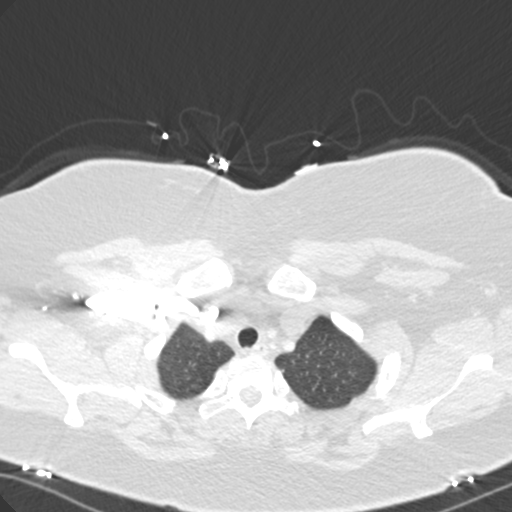
[im 248/261  mediastinal]
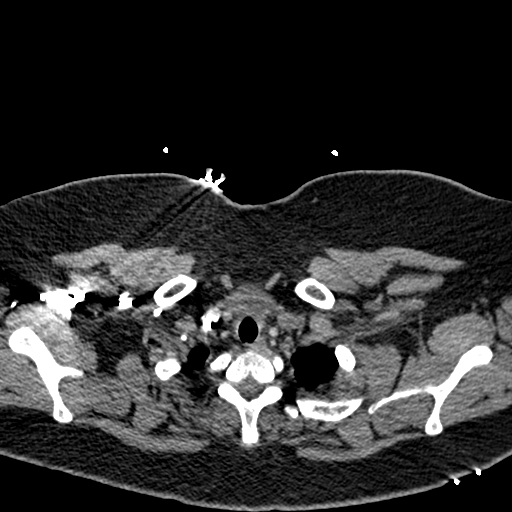

[Series 7: coronal mpr · coronal · 0.55mm/px · 1 of 145 slices shown]
[im 73/145  mediastinal]
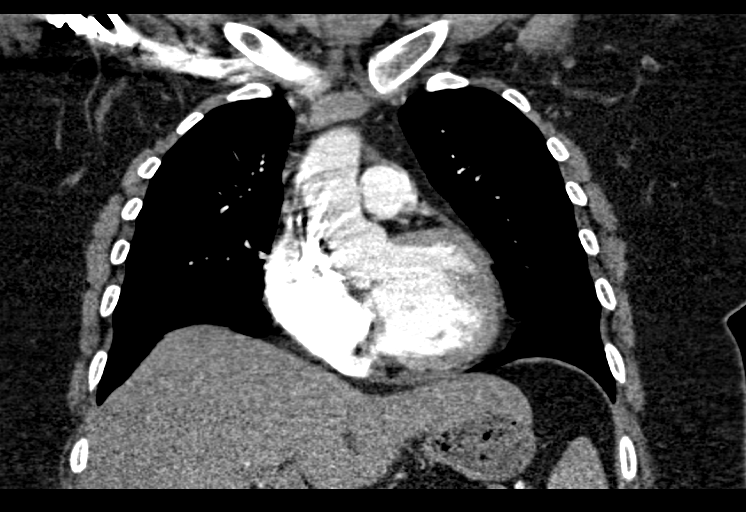

[19 of 36 positions shown; findings below may reference images not displayed]

FINDINGS: Cardiovascular: No filling defects in the pulmonary arteries to
suggest pulmonary emboli. Heart is normal size. Aorta is normal
caliber.

Mediastinum/Nodes: No mediastinal, hilar, or axillary adenopathy.
Trachea and esophagus are unremarkable.

Lungs/Pleura: 2-3 mm calcified granuloma anteriorly in the left
lower lobe. No suspicious pulmonary nodules or confluent opacities.
No effusions.

Upper Abdomen: Imaging into the upper abdomen shows no acute
findings.

Musculoskeletal: Chest wall soft tissues are unremarkable. No acute
bony abnormality.

Review of the MIP images confirms the above findings.
IMPRESSION: No evidence of pulmonary embolus.

No acute cardiopulmonary disease.

## 2021-06-30 IMAGING — CR DG CHEST 2V
2 series · 2 of 2 positions shown · non-contrast
Comparison: Chest x-ray dated [DATE].

CLINICAL DATA: Chest pain thyroid removed on [REDACTED].

EXAM:
CHEST - 2 VIEW

[w chest pa]
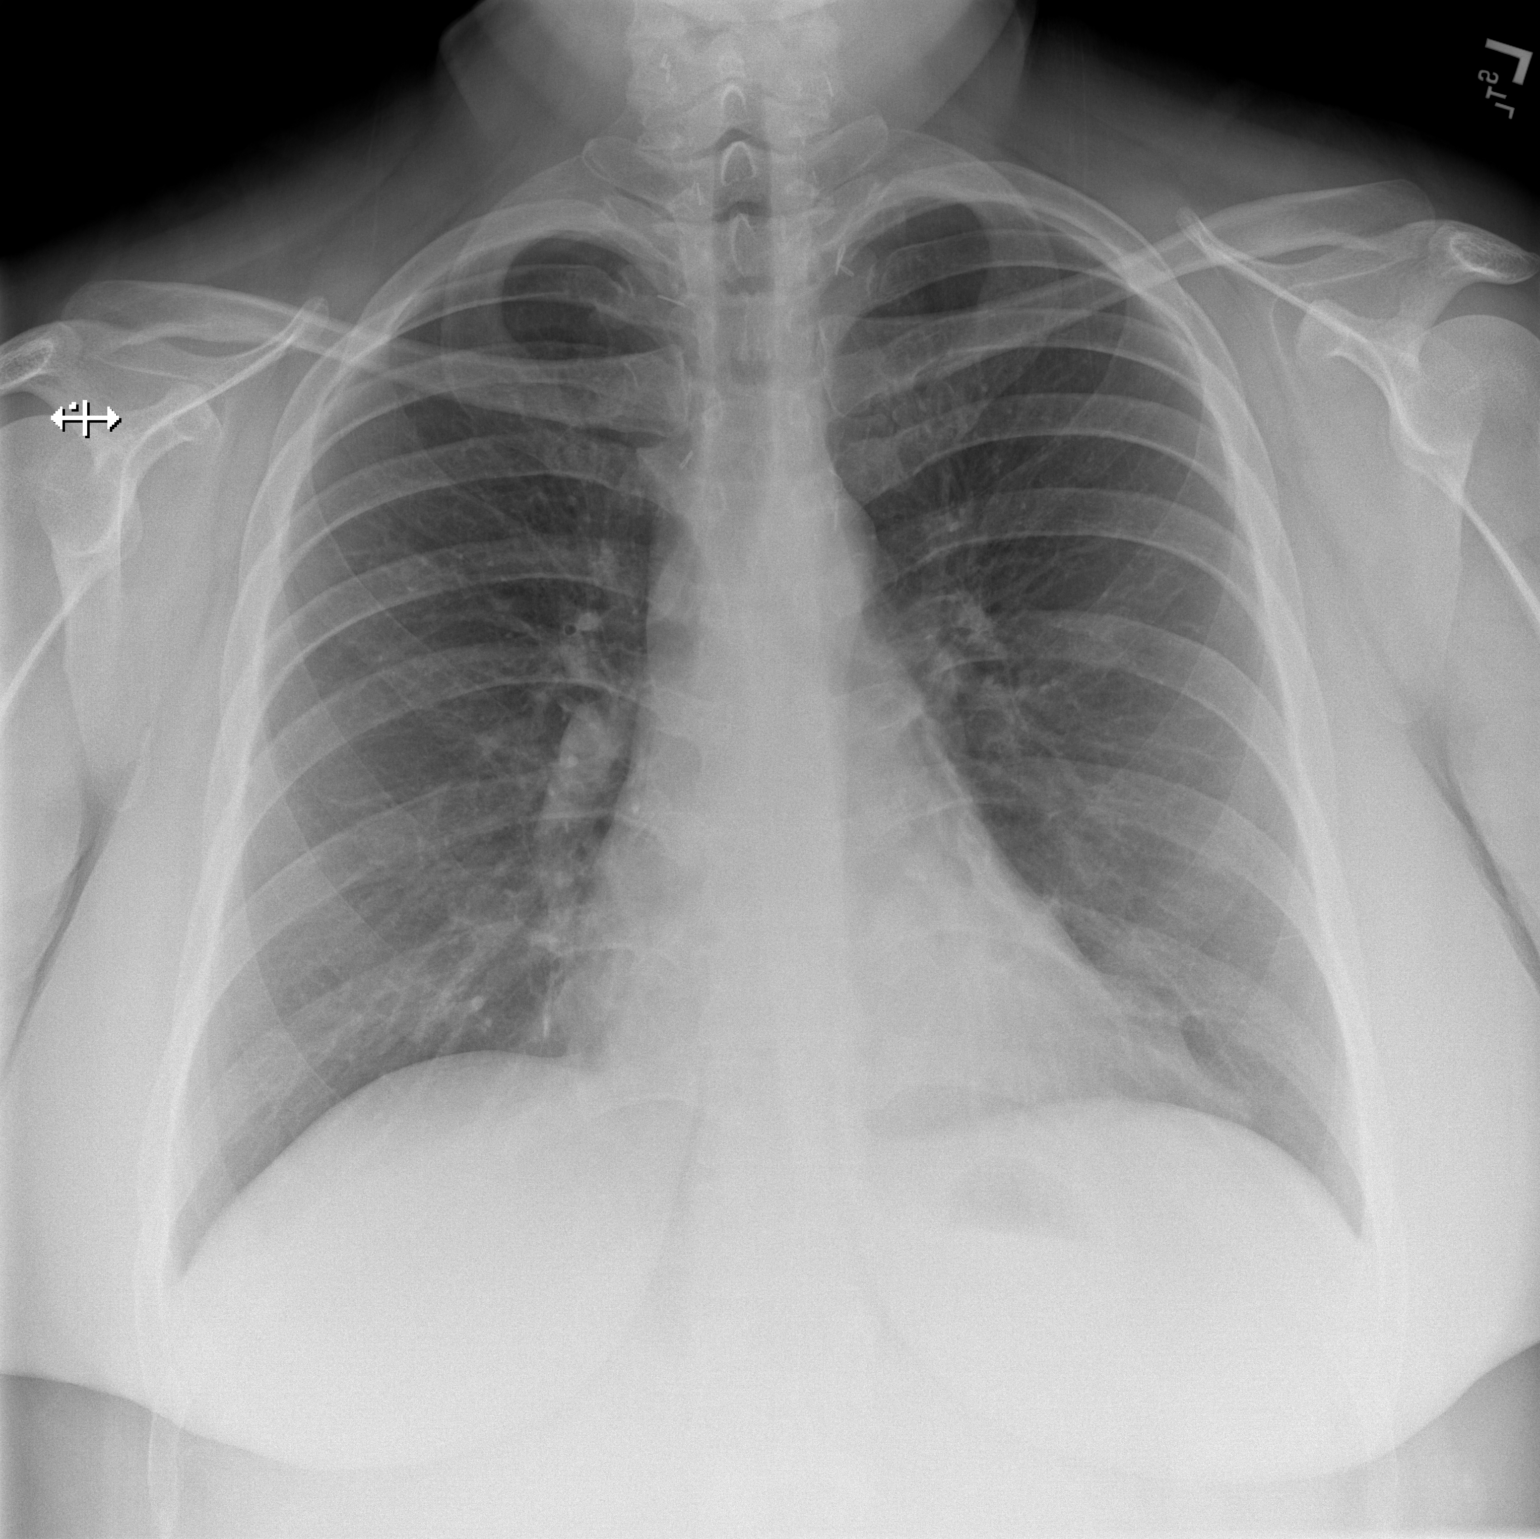

[w chest lat]
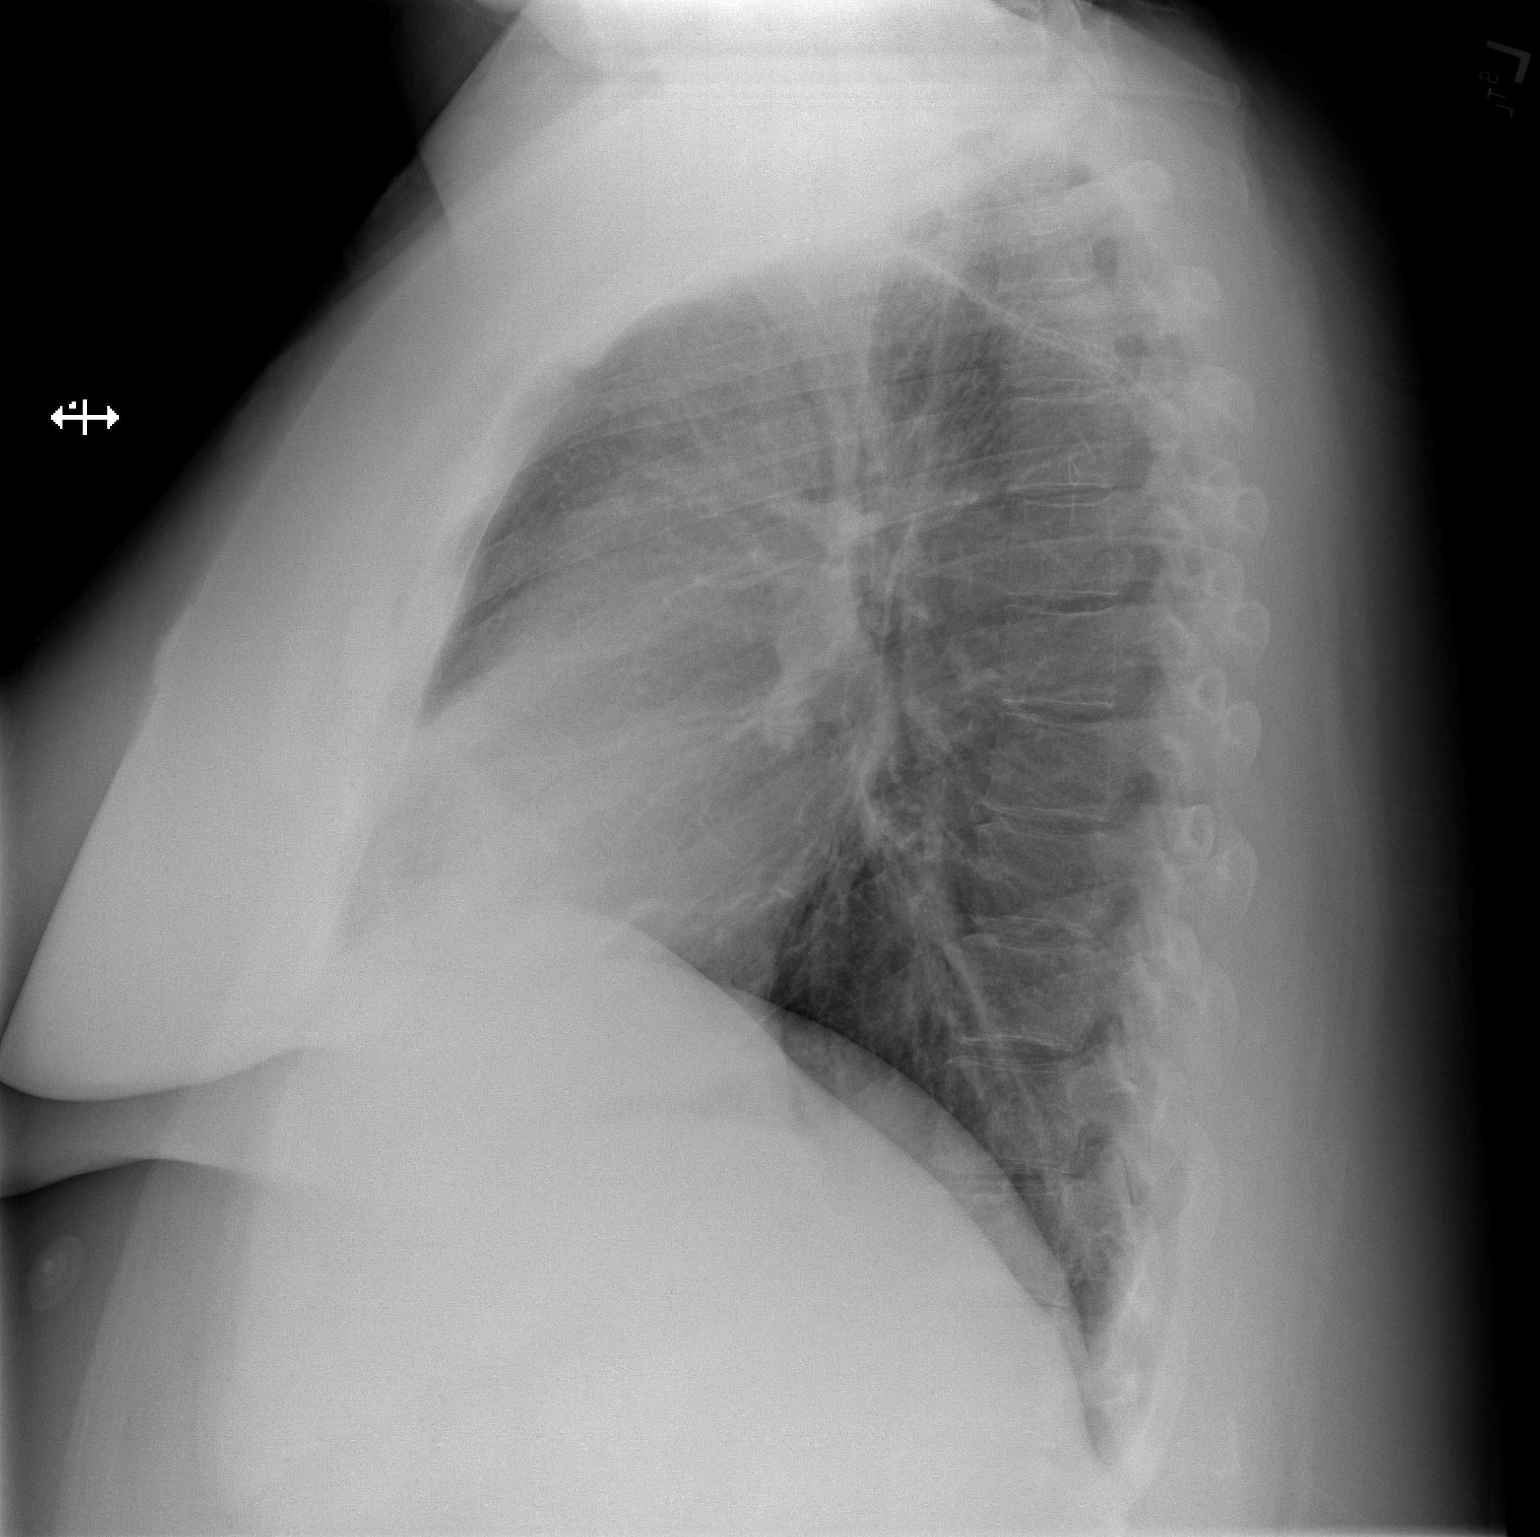

[2 of 2 positions shown; findings below may reference images not displayed]

FINDINGS: Cardiomediastinal silhouette is within normal limits in size and
configuration. Lungs are clear. Lung volumes are normal. No evidence
of pneumonia. No pleural effusion. No pneumothorax seen. Osseous
structures about the chest are unremarkable.
IMPRESSION: Normal chest x-ray.

## 2021-07-13 DIAGNOSIS — Z6835 Body mass index (BMI) 35.0-35.9, adult: Secondary | ICD-10-CM | POA: Diagnosis not present

## 2021-07-13 DIAGNOSIS — E559 Vitamin D deficiency, unspecified: Secondary | ICD-10-CM | POA: Diagnosis not present

## 2021-07-27 DIAGNOSIS — R5382 Chronic fatigue, unspecified: Secondary | ICD-10-CM | POA: Diagnosis not present

## 2021-07-27 DIAGNOSIS — E559 Vitamin D deficiency, unspecified: Secondary | ICD-10-CM | POA: Diagnosis not present

## 2021-07-27 DIAGNOSIS — Z6834 Body mass index (BMI) 34.0-34.9, adult: Secondary | ICD-10-CM | POA: Diagnosis not present

## 2021-09-07 DIAGNOSIS — R5382 Chronic fatigue, unspecified: Secondary | ICD-10-CM | POA: Diagnosis not present

## 2021-09-07 DIAGNOSIS — N951 Menopausal and female climacteric states: Secondary | ICD-10-CM | POA: Diagnosis not present

## 2021-09-21 DIAGNOSIS — R5382 Chronic fatigue, unspecified: Secondary | ICD-10-CM | POA: Diagnosis not present

## 2021-09-21 DIAGNOSIS — R6882 Decreased libido: Secondary | ICD-10-CM | POA: Diagnosis not present

## 2021-09-21 DIAGNOSIS — Z6834 Body mass index (BMI) 34.0-34.9, adult: Secondary | ICD-10-CM | POA: Diagnosis not present

## 2021-09-21 DIAGNOSIS — Z85850 Personal history of malignant neoplasm of thyroid: Secondary | ICD-10-CM | POA: Diagnosis not present

## 2021-09-22 DIAGNOSIS — E890 Postprocedural hypothyroidism: Secondary | ICD-10-CM | POA: Diagnosis not present

## 2021-10-12 DIAGNOSIS — R635 Abnormal weight gain: Secondary | ICD-10-CM | POA: Diagnosis not present

## 2021-10-12 DIAGNOSIS — Z85850 Personal history of malignant neoplasm of thyroid: Secondary | ICD-10-CM | POA: Diagnosis not present

## 2021-10-12 DIAGNOSIS — Z6834 Body mass index (BMI) 34.0-34.9, adult: Secondary | ICD-10-CM | POA: Diagnosis not present

## 2021-10-12 DIAGNOSIS — R5382 Chronic fatigue, unspecified: Secondary | ICD-10-CM | POA: Diagnosis not present

## 2021-10-19 ENCOUNTER — Encounter (INDEPENDENT_AMBULATORY_CARE_PROVIDER_SITE_OTHER): Payer: Self-pay

## 2021-10-19 DIAGNOSIS — R635 Abnormal weight gain: Secondary | ICD-10-CM | POA: Diagnosis not present

## 2021-10-19 DIAGNOSIS — E559 Vitamin D deficiency, unspecified: Secondary | ICD-10-CM | POA: Diagnosis not present

## 2021-10-19 DIAGNOSIS — Z6834 Body mass index (BMI) 34.0-34.9, adult: Secondary | ICD-10-CM | POA: Diagnosis not present

## 2021-10-27 ENCOUNTER — Ambulatory Visit (INDEPENDENT_AMBULATORY_CARE_PROVIDER_SITE_OTHER): Payer: BC Managed Care – PPO | Admitting: Podiatry

## 2021-10-27 ENCOUNTER — Ambulatory Visit (INDEPENDENT_AMBULATORY_CARE_PROVIDER_SITE_OTHER): Payer: BC Managed Care – PPO

## 2021-10-27 ENCOUNTER — Encounter: Payer: Self-pay | Admitting: Podiatry

## 2021-10-27 DIAGNOSIS — M7752 Other enthesopathy of left foot: Secondary | ICD-10-CM

## 2021-10-27 DIAGNOSIS — M779 Enthesopathy, unspecified: Secondary | ICD-10-CM

## 2021-10-27 DIAGNOSIS — M79672 Pain in left foot: Secondary | ICD-10-CM | POA: Diagnosis not present

## 2021-10-27 MED ORDER — DICLOFENAC SODIUM 75 MG PO TBEC: 75.0000 mg | DELAYED_RELEASE_TABLET | Freq: Two times a day (BID) | ORAL | 2 refills | Status: AC

## 2021-10-27 MED ORDER — TRIAMCINOLONE ACETONIDE 10 MG/ML IJ SUSP
10.0000 mg | Freq: Once | INTRAMUSCULAR | Status: AC
  Administered 2021-10-27: 10 mg

## 2021-10-27 NOTE — Progress Notes (Signed)
Subjective:   Patient ID: Tara Ferguson, female   DOB: 41 y.o.   MRN: [id number]   HPI Patient presents with a lot of pain in her left ankle and states that its been sharp and shooting and that its been going on for a number of months worse over the last month.  Does not remember injury does walk patient is moderately obese does not smoke likes to be active   Review of Systems  All other systems reviewed and are negative.       Objective:  Physical Exam Vitals and nursing note reviewed.  Constitutional:      Appearance: She is well-developed.  Pulmonary:     Effort: Pulmonary effort is normal.  Musculoskeletal:        General: Normal range of motion.  Skin:    General: Skin is warm.  Neurological:     Mental Status: She is alert.     Neurovascular status found to be intact muscle strength found to be adequate range of motion within normal limits with patient found to have inflammation pain mostly in the sinus tarsi left with inversion eversion of the foot and also mildly into the lateral ankle gutter.  Patient has good digital perfusion well oriented x3     Assessment:  Inflammatory capsulitis of the sinus tarsi left with fluid buildup along with moderate depression of the arch no crepitus of the joint     Plan:  H&P reviewed condition and x-ray and today sterile prep injected the sinus tarsi left 3 mg Kenalog 5 mg Xylocaine applied fascial brace to lift up the arch and hold the foot in position gave instructions for supportive shoe therapy and also placed on oral anti-inflammatory diclofenac.  Reappoint to recheck 2 weeks  X-rays indicate that there is no signs of arthritis of the joint appears to be soft tissue capsular inflammation

## 2021-11-02 DIAGNOSIS — Z6833 Body mass index (BMI) 33.0-33.9, adult: Secondary | ICD-10-CM | POA: Diagnosis not present

## 2021-11-02 DIAGNOSIS — R635 Abnormal weight gain: Secondary | ICD-10-CM | POA: Diagnosis not present

## 2021-11-02 DIAGNOSIS — R5382 Chronic fatigue, unspecified: Secondary | ICD-10-CM | POA: Diagnosis not present

## 2021-11-09 ENCOUNTER — Other Ambulatory Visit: Payer: Self-pay | Admitting: Podiatry

## 2021-11-09 DIAGNOSIS — M779 Enthesopathy, unspecified: Secondary | ICD-10-CM

## 2021-11-10 ENCOUNTER — Ambulatory Visit (INDEPENDENT_AMBULATORY_CARE_PROVIDER_SITE_OTHER): Payer: BC Managed Care – PPO | Admitting: Podiatry

## 2021-11-10 ENCOUNTER — Ambulatory Visit (INDEPENDENT_AMBULATORY_CARE_PROVIDER_SITE_OTHER): Payer: BC Managed Care – PPO

## 2021-11-10 DIAGNOSIS — M7751 Other enthesopathy of right foot: Secondary | ICD-10-CM | POA: Diagnosis not present

## 2021-11-10 DIAGNOSIS — S99921A Unspecified injury of right foot, initial encounter: Secondary | ICD-10-CM

## 2021-11-10 MED ORDER — TRIAMCINOLONE ACETONIDE 10 MG/ML IJ SUSP
10.0000 mg | Freq: Once | INTRAMUSCULAR | Status: AC
  Administered 2021-11-10: 10 mg

## 2021-11-10 NOTE — Progress Notes (Signed)
Subjective:   Patient ID: Tara Ferguson, female   DOB: 41 y.o.   MRN: [id number]   HPI Patient states her left foot is feeling quite a bit better but she banged her right foot and its been swollen around the fifth MPJ and she is interested in anything we can do that would help relieve some of the discomfort.  States its been very very tender since she did it several days ago   ROS      Objective:  Physical Exam  Neurovascular status intact with patient found to have significant improvement in the left sinus tarsi with diminished discomfort with the right fifth MPJ found to be inflamed and swollen around the fifth metatarsal head no proximal pain     Assessment:  Possibility for injury to the right forefoot with improved left sinus tarsi secondary to treatment     Plan:  H&P discussed good shoe gear and at this point for the right since there is fluid buildup around the fifth MPJ I did a careful injection quarter cc dexamethasone quarter cc Kenalog half cc of Xylocaine advised on wider shoes ice and reappoint as symptoms indicate  X-rays indicate there may have been a fracture of the shaft fifth metatarsal but it does not appear related to the current issue and swelling around the fifth MPJ noted

## 2021-11-16 DIAGNOSIS — Z6833 Body mass index (BMI) 33.0-33.9, adult: Secondary | ICD-10-CM | POA: Diagnosis not present

## 2021-11-16 DIAGNOSIS — R635 Abnormal weight gain: Secondary | ICD-10-CM | POA: Diagnosis not present

## 2021-11-16 DIAGNOSIS — E559 Vitamin D deficiency, unspecified: Secondary | ICD-10-CM | POA: Diagnosis not present

## 2021-11-30 DIAGNOSIS — E559 Vitamin D deficiency, unspecified: Secondary | ICD-10-CM | POA: Diagnosis not present

## 2021-11-30 DIAGNOSIS — Z6832 Body mass index (BMI) 32.0-32.9, adult: Secondary | ICD-10-CM | POA: Diagnosis not present

## 2021-11-30 DIAGNOSIS — R635 Abnormal weight gain: Secondary | ICD-10-CM | POA: Diagnosis not present

## 2021-12-14 DIAGNOSIS — R5382 Chronic fatigue, unspecified: Secondary | ICD-10-CM | POA: Diagnosis not present

## 2021-12-14 DIAGNOSIS — R635 Abnormal weight gain: Secondary | ICD-10-CM | POA: Diagnosis not present

## 2021-12-14 DIAGNOSIS — N951 Menopausal and female climacteric states: Secondary | ICD-10-CM | POA: Diagnosis not present

## 2021-12-14 DIAGNOSIS — E559 Vitamin D deficiency, unspecified: Secondary | ICD-10-CM | POA: Diagnosis not present

## 2021-12-14 DIAGNOSIS — Z13220 Encounter for screening for lipoid disorders: Secondary | ICD-10-CM | POA: Diagnosis not present

## 2021-12-14 DIAGNOSIS — Z131 Encounter for screening for diabetes mellitus: Secondary | ICD-10-CM | POA: Diagnosis not present

## 2021-12-21 DIAGNOSIS — Z6832 Body mass index (BMI) 32.0-32.9, adult: Secondary | ICD-10-CM | POA: Diagnosis not present

## 2021-12-21 DIAGNOSIS — R5382 Chronic fatigue, unspecified: Secondary | ICD-10-CM | POA: Diagnosis not present

## 2021-12-21 DIAGNOSIS — E88819 Insulin resistance, unspecified: Secondary | ICD-10-CM | POA: Diagnosis not present

## 2021-12-28 DIAGNOSIS — N951 Menopausal and female climacteric states: Secondary | ICD-10-CM | POA: Diagnosis not present

## 2022-01-11 DIAGNOSIS — E559 Vitamin D deficiency, unspecified: Secondary | ICD-10-CM | POA: Diagnosis not present

## 2022-01-11 DIAGNOSIS — Z131 Encounter for screening for diabetes mellitus: Secondary | ICD-10-CM | POA: Diagnosis not present

## 2022-01-11 DIAGNOSIS — Z6832 Body mass index (BMI) 32.0-32.9, adult: Secondary | ICD-10-CM | POA: Diagnosis not present

## 2022-01-11 DIAGNOSIS — E162 Hypoglycemia, unspecified: Secondary | ICD-10-CM | POA: Diagnosis not present

## 2022-01-25 DIAGNOSIS — E559 Vitamin D deficiency, unspecified: Secondary | ICD-10-CM | POA: Diagnosis not present

## 2022-01-25 DIAGNOSIS — E669 Obesity, unspecified: Secondary | ICD-10-CM | POA: Diagnosis not present

## 2022-02-14 DIAGNOSIS — K5903 Drug induced constipation: Secondary | ICD-10-CM | POA: Diagnosis not present

## 2022-02-14 DIAGNOSIS — Z6832 Body mass index (BMI) 32.0-32.9, adult: Secondary | ICD-10-CM | POA: Diagnosis not present

## 2022-02-14 DIAGNOSIS — R5382 Chronic fatigue, unspecified: Secondary | ICD-10-CM | POA: Diagnosis not present

## 2022-02-28 DIAGNOSIS — E669 Obesity, unspecified: Secondary | ICD-10-CM | POA: Diagnosis not present

## 2022-02-28 DIAGNOSIS — E559 Vitamin D deficiency, unspecified: Secondary | ICD-10-CM | POA: Diagnosis not present

## 2023-01-24 ENCOUNTER — Encounter: Payer: Self-pay | Admitting: Psychiatry

## 2023-05-17 ENCOUNTER — Encounter: Payer: Self-pay | Admitting: Podiatry

## 2023-05-17 ENCOUNTER — Ambulatory Visit (INDEPENDENT_AMBULATORY_CARE_PROVIDER_SITE_OTHER)

## 2023-05-17 ENCOUNTER — Ambulatory Visit: Payer: BC Managed Care – PPO | Admitting: Podiatry

## 2023-05-17 DIAGNOSIS — M79672 Pain in left foot: Secondary | ICD-10-CM

## 2023-05-17 DIAGNOSIS — M779 Enthesopathy, unspecified: Secondary | ICD-10-CM | POA: Diagnosis not present

## 2023-05-17 MED ORDER — TRIAMCINOLONE ACETONIDE 10 MG/ML IJ SUSP
10.0000 mg | Freq: Once | INTRAMUSCULAR | Status: AC
  Administered 2023-05-17: 10 mg via INTRA_ARTICULAR

## 2023-05-18 NOTE — Progress Notes (Signed)
 Subjective:   Patient ID: Tara Ferguson, female   DOB: 43 y.o.   MRN: [id number]   HPI Patient presents with a lot of pain in the left forefoot midfoot area.  States that this started in the last couple months it has been difficult to bear weight and does not remember specific injury and has not been seen for around a year and a half with no changes in health history   ROS      Objective:  Physical Exam  Neurovascular status intact with inflammation pain in the midfoot distal bilateral mild discomfort also in the MPJs with fluid buildup with patient having history of orthotic devices     Assessment:  Appears to be an inflammatory condition may be bone related or possible tendinitis with possible inflammatory capsulitis     Plan:  H&P x-rays reviewed.  At this point I did sterile prep and injected the dorsal tendon complex 3 mg dexamethasone Kenalog 5 mg Xylocaine and I advised a more rigid bottom shoes.  I want to see back in the next couple weeks may require more aggressive approach if symptoms were to indicate  X-rays indicate that there is a squared off second metatarsal head left but no indication of stress fracture or other pathology

## 2023-05-31 ENCOUNTER — Ambulatory Visit: Admitting: Podiatry

## 2023-05-31 ENCOUNTER — Encounter: Payer: Self-pay | Admitting: Podiatry

## 2023-05-31 DIAGNOSIS — M779 Enthesopathy, unspecified: Secondary | ICD-10-CM

## 2023-05-31 DIAGNOSIS — M7751 Other enthesopathy of right foot: Secondary | ICD-10-CM | POA: Diagnosis not present

## 2023-05-31 MED ORDER — DICLOFENAC SODIUM 75 MG PO TBEC: 75.0000 mg | DELAYED_RELEASE_TABLET | Freq: Two times a day (BID) | ORAL | 2 refills | Status: AC

## 2023-06-01 NOTE — Progress Notes (Signed)
 Subjective:   Patient ID: Tara Ferguson, female   DOB: 43 y.o.   MRN: [id number]   HPI Patient presents with a lot of pain in the left foot states that it did not really respond to medication and it continues to be bothersome with activity and hard for her to walk on   ROS      Objective:  Physical Exam  Neurovascular status intact with inflammation forefoot left with no breakdown of tissue noted.  Patient has discomfort also extending into the MPJs     Assessment:  Appears to be significant inflammatory condition left with possibility for distal tendinitis metatarsal phalangeal joint inflammation     Plan:  H&P reviewed and I discussed treatment options and I have recommended immobilization with air fracture walker.  Patient wants this done fitted to her lower leg instructions on usage and I want her to wear this full-time for 3 weeks and then can reduce.  Patient is continuing anti-inflammatories and ice therapy and will be seen back 4 weeks earlier if necessary

## 2023-06-05 ENCOUNTER — Other Ambulatory Visit (HOSPITAL_COMMUNITY): Payer: Self-pay | Admitting: Endocrinology

## 2023-06-05 ENCOUNTER — Encounter (HOSPITAL_COMMUNITY): Payer: Self-pay | Admitting: Endocrinology

## 2023-06-05 DIAGNOSIS — C73 Malignant neoplasm of thyroid gland: Secondary | ICD-10-CM

## 2023-06-07 NOTE — Written Directive (Addendum)
 I-131 WHOLE BODY SCAN    RADIOPHARMACEUTICAL: Iodine-131 Capsule for Diagnostic Imaging   PRESCRIBED DOSE FOR ADMINISTRATION: 4 mCi   ROUTE OFADMINISTRATION: PO   DIAGNOSIS: Thyroid Cancer  REFERRING PHYSICIAN:Bindubal Talmage Nap, MD   THYROGEN STIMULATION OR HORMONE WITHDRAW: Thyrogen Stimulating  DATE OF THYROIDECTOMY:   12-24-18   SURGEON:Todd Gerkin, MD    TSH:   Lab Results  Component Value Date   TSH 24.64 (H) 02/03/2019   TSH 1.580 02/07/2016     PRIOR I-131 THERAPY (Date and Dose): NA  ADDITIONAL PHYSICIAN COMMENTS/NOTES   AUTHORIZED USER SIGNATURE & TIME STAMP: Patriciaann Clan, MD   06/07/23    4:46 PM

## 2023-06-18 ENCOUNTER — Encounter (HOSPITAL_COMMUNITY)
Admission: RE | Admit: 2023-06-18 | Discharge: 2023-06-18 | Disposition: A | Discharge status: Home / Self Care | Source: Ambulatory Visit | Attending: Endocrinology | Admitting: Endocrinology

## 2023-06-19 ENCOUNTER — Encounter (HOSPITAL_COMMUNITY)

## 2023-06-20 ENCOUNTER — Encounter (HOSPITAL_COMMUNITY)

## 2023-06-22 ENCOUNTER — Encounter (HOSPITAL_COMMUNITY)

## 2023-06-28 ENCOUNTER — Ambulatory Visit: Admitting: Podiatry
